# Patient Record
Sex: Male | Born: 1965
Health system: Southern US, Community
[De-identification: ages and names within clinical notes are randomized; demographics above are authoritative.]

## PROBLEM LIST (undated history)

## (undated) DIAGNOSIS — S43006A Unspecified dislocation of unspecified shoulder joint, initial encounter: Secondary | ICD-10-CM

## (undated) DIAGNOSIS — M545 Low back pain, unspecified: Secondary | ICD-10-CM

## (undated) HISTORY — PX: HERNIA REPAIR: SHX51

## (undated) HISTORY — PX: CERVICAL DISC SURGERY: SHX588

## (undated) HISTORY — PX: NECK SURGERY: SHX720

---

## 2003-08-28 ENCOUNTER — Encounter (INDEPENDENT_AMBULATORY_CARE_PROVIDER_SITE_OTHER): Payer: Self-pay | Admitting: *Deleted

## 2003-08-29 ENCOUNTER — Ambulatory Visit (HOSPITAL_COMMUNITY): Admission: RE | Admit: 2003-08-29 | Discharge: 2003-08-29 | Payer: Self-pay | Admitting: General Surgery

## 2003-08-29 ENCOUNTER — Ambulatory Visit (HOSPITAL_BASED_OUTPATIENT_CLINIC_OR_DEPARTMENT_OTHER): Admission: RE | Admit: 2003-08-29 | Discharge: 2003-08-29 | Payer: Self-pay | Admitting: General Surgery

## 2006-02-21 ENCOUNTER — Ambulatory Visit (HOSPITAL_COMMUNITY): Admission: RE | Admit: 2006-02-21 | Discharge: 2006-02-22 | Payer: Self-pay | Admitting: Neurosurgery

## 2008-07-25 ENCOUNTER — Encounter: Admission: RE | Admit: 2008-07-25 | Discharge: 2008-07-25 | Payer: Self-pay | Admitting: Internal Medicine

## 2008-12-31 ENCOUNTER — Encounter: Admission: RE | Admit: 2008-12-31 | Discharge: 2009-02-27 | Payer: Self-pay | Admitting: Internal Medicine

## 2009-02-27 ENCOUNTER — Encounter: Admission: RE | Admit: 2009-02-27 | Discharge: 2009-02-27 | Payer: Self-pay | Admitting: Internal Medicine

## 2009-04-26 ENCOUNTER — Emergency Department (HOSPITAL_COMMUNITY): Admission: EM | Admit: 2009-04-26 | Discharge: 2009-04-26 | Payer: Self-pay | Admitting: Emergency Medicine

## 2010-09-04 NOTE — Op Note (Signed)
Noah Smith, Noah Smith               ACCOUNT NO.:  [id number]   MEDICAL RECORD NO.:  [id number]          PATIENT TYPE:  AMB   LOCATION:  SDS                          FACILITY:  [facility]   PHYSICIAN:  Cristi Loron, M.D.DATE OF BIRTH:  [birth year]   DATE OF PROCEDURE:  02/21/2006  DATE OF DISCHARGE:                                 OPERATIVE REPORT   PREOPERATIVE DIAGNOSES:  C5-6 herniated nucleus pulposus, spinal stenosis,  cervical radiculopathy, cervicalgia.   POSTOPERATIVE DIAGNOSES:  C5-6 herniated nucleus pulposus, spinal stenosis,  cervical radiculopathy, cervicalgia.   PROCEDURES:  C5-6 extensive anterior cervical diskectomy/decompression; C5-6  disk arthroplasty with a Prestige artificial disk.   SURGEON:  Cristi Loron, M.D.   ASSISTANT:  Hewitt Shorts, M.D.   ANESTHESIA:  General endotracheal.   ESTIMATED BLOOD LOSS:  100 cc.   SPECIMENS:  None.   DRAINS:  None.   COMPLICATIONS:  None.   BRIEF HISTORY:  The patient is a 45 year old black male, who suffered from  neck and right arm pain consistent with a right C6 radiculopathy.  He failed  medical management and was worked up with a cervical MRI, which demonstrated  a herniated disk at C5-6 on the right.  I discussed the various treatment  options with the patient, including surgery.  The patient has weighed the  risks, benefits and alternatives of surgery and decided to proceed with a C5-  6 anterior cervical diskectomy, as well as a disk arthroplasty with a  Prestige artificial disk.   DESCRIPTION OF PROCEDURES:  The patient was brought to the operating room by  the anesthesia team.  General endotracheal anesthesia was induced.  The  patient remained in supine position.  A small roll was placed under his  shoulders to keep his neck in the neutral position.  The patient's anterior  cervical region was then prepared with Betadine Scrub and Betadine Solution  and sterile drapes were applied.  I then  injected the area to be incised  with Marcaine with epinephrine solution and used a scalpel to make a  transverse incision in the patient's left anterior neck.  I used a  Metzenbaum scissors to divide the platysma muscle and then to dissect medial  to the sternocleidomastoid, jugular vein and carotid artery.  I carefully  dissected down towards the anterior cervical spine, identifying the  esophagus and retracting it medially with the handheld retractors.  We then  used the Kitner swabs to clear soft tissue from the anterior cervical spine,  and then inserted a bent spinal needle into the exposed interspace.  We  obtained intraoperative radiograph to confirm our location.   We then used electrocautery to detach the medial border of the longus colli  muscle bilaterally from the C5-6 intervertebral disk space.  We inserted a  Caspar self-retaining retractor for exposure.  We then excised the C5-6  intervertebral disk with a 15-blade scalpel and performed a partial  intervertebral diskectomy using the pituitary forceps and the Karlin curets.  We then inserted the distraction pins into the upper aspect of the C5 and  the lower  aspect of the C6 vertebral bodies, and then distracted the  interspace, and used a high-speed drill to drill away the remainder of the  C5-6 intervertebral disk, to drill away some posterior spondylosis, and to  thin down the posterior longitudinal ligament.  We then incised and thinned  down the ligament with an arachnoid knife, and then removed it with a  Kerrison punch, undercutting the vertebral endplates, decompressing the  thecal sac.  We then performed a foraminotomy about the bilateral C6 nerve  root, completing the decompression.  Of note, we did encounter the expected  herniated disk on the right, compressing the right C6 nerve root.   Having completed the decompression, we now turned our attention to the disk  arthroplasty.  We used the barrel-type drill bit  to shape the vertebral  endplates so that the trialers would fit into the interspace, being careful  not to aggressively decorticate the vertebral endplates.  We then inserted  the 6 x 16-mm trialer into the interspace, and under fluoroscopy, it looked  to be in good position and of good size.  We also confirmed that the  distraction pins were in the midline on the A/P view of the fluoroscopy as  well.  We then inserted a 6 mm x 16-mm Prestige artificial disk into the  interspace at C5-6.  I should mention at this point that we had no traction  on the vertebral bodies, and the disk fit in there snug, but we did not have  to impact it with a mallet or anything of the sort.  We simply were able to  slide it snugly into the interspace.  We obtained lateral fluoroscopy and it  looked like the facets were not overdistracted and that the prosthesis was  in good position.  We then used a drill guide to drill four 13-mm holes  through the Prestige disk into the vertebral bodies.  We then secured the  prosthesis into the interspace by placing two 13-mm screws at C5 and two at  C6.  We then again obtained intraoperative fluoroscopy and demonstrated good  position of the prosthesis.  We then secured the screws in place by placing  the locking cap, which we tightened appropriately.  We then irrigated the  wound out with Bacitracin solution, then removed the retractor.  We  inspected the esophagus for any damage, and there was none apparent.  We  then obtained A/P fluoroscopy and the construct looked good in the A/P view  as well.  We then obtained hemostasis with bipolar cautery.  We then  reapproximated the patient's platysma muscle with interrupted 3-0 Vicryl  suture, subcutaneous tissue with interrupted 3-0 Vicryl suture, and the skin  with Steri-Strips and benzoin.  The wound was then coated with Bacitracin  ointment.  A sterile dressing was applied.  The drapes were removed, and the patient was  subsequently extubated by the anesthesia team and transported to  the postanesthesia care unit in stable condition.  All sponge, instrument  and needle counts were correct at the end of this case.      Cristi Loron, M.D.  Electronically Signed     JDJ/MEDQ  D:  02/21/2006  T:  02/21/2006  Job:  [id number]   cc:   Hewitt Shorts, M.D.

## 2012-09-12 ENCOUNTER — Encounter (HOSPITAL_COMMUNITY): Payer: Self-pay

## 2012-09-12 ENCOUNTER — Emergency Department (HOSPITAL_COMMUNITY)
Admission: EM | Admit: 2012-09-12 | Discharge: 2012-09-12 | Disposition: A | Discharge status: Home / Self Care | Payer: BC Managed Care – PPO | Attending: Emergency Medicine | Admitting: Emergency Medicine

## 2012-09-12 DIAGNOSIS — H6120 Impacted cerumen, unspecified ear: Secondary | ICD-10-CM | POA: Insufficient documentation

## 2012-09-12 DIAGNOSIS — Y929 Unspecified place or not applicable: Secondary | ICD-10-CM | POA: Insufficient documentation

## 2012-09-12 DIAGNOSIS — F17200 Nicotine dependence, unspecified, uncomplicated: Secondary | ICD-10-CM | POA: Insufficient documentation

## 2012-09-12 DIAGNOSIS — Y9302 Activity, running: Secondary | ICD-10-CM | POA: Insufficient documentation

## 2012-09-12 DIAGNOSIS — T148XXA Other injury of unspecified body region, initial encounter: Secondary | ICD-10-CM

## 2012-09-12 DIAGNOSIS — S838X9A Sprain of other specified parts of unspecified knee, initial encounter: Secondary | ICD-10-CM | POA: Insufficient documentation

## 2012-09-12 DIAGNOSIS — X500XXA Overexertion from strenuous movement or load, initial encounter: Secondary | ICD-10-CM | POA: Insufficient documentation

## 2012-09-12 DIAGNOSIS — S86819A Strain of other muscle(s) and tendon(s) at lower leg level, unspecified leg, initial encounter: Secondary | ICD-10-CM | POA: Insufficient documentation

## 2012-09-12 DIAGNOSIS — H6122 Impacted cerumen, left ear: Secondary | ICD-10-CM

## 2012-09-12 DIAGNOSIS — H9209 Otalgia, unspecified ear: Secondary | ICD-10-CM | POA: Insufficient documentation

## 2012-09-12 MED ORDER — DOCUSATE SODIUM 250 MG PO CAPS: 250.0000 mg | ORAL_CAPSULE | Freq: Every day | ORAL | Status: DC

## 2012-09-12 NOTE — ED Notes (Signed)
Pt presents with right leg pain. Pt was playing with his 48 year old son yesterday and felt something "pop" in the back of his upper right leg. Able to ambulate since then but with pain. Pt also c/o his left ear feeling "stopped up" for about a week.

## 2012-09-12 NOTE — ED Provider Notes (Signed)
History    This chart was scribed for Noah Horseman PA-C, a non-physician practitioner working with Gwyneth Sprout, MD by Lewanda Rife, ED Scribe. This patient was seen in room WTR8/WTR8 and the patient's care was started at 1759.      CSN: [id number]  Arrival date & time 09/12/12  1603   First MD Initiated Contact with Patient 09/12/12 1728      Chief Complaint  Patient presents with  . Leg Pain    (Consider location/radiation/quality/duration/timing/severity/associated sxs/prior treatment) The history is provided by the patient.   HPI Comments: Noah Smith is a 47 y.o. male who presents to the Emergency Department complaining of constant moderate right posterior upper leg pain onset last night. Reports racing with his son yesterday and felt a "pop" behind his leg. Additionally, reports left ear discomfort. Denies emesis, rhinorrhea, constipation, dizziness, fever, and weakness. Reports pain is aggravated when sitting and to touch. Denies alleviating factors. Reports steady, but stiff gait with pain. Reports he is walks frequently at work. Reports trying generic ear drops with no relief of ear discomfort.   History reviewed. No pertinent past medical history.  Past Surgical History  Procedure Laterality Date  . Hernia repair    . Neck surgery      No family history on file.  History  Substance Use Topics  . Smoking status: Current Every Day Smoker -- 0.50 packs/day  . Smokeless tobacco: Not on file  . Alcohol Use: Yes     Comment: weekends       Review of Systems  HENT: Positive for ear pain.   Musculoskeletal: Positive for myalgias (posterior upper right leg pain).  Skin: Negative for wound.  All other systems reviewed and are negative.   A complete 10 system review of systems was obtained and all systems are negative except as noted in the HPI and PMH.    Allergies  Review of patient's allergies indicates no known allergies.  Home Medications    Current Outpatient Rx  Name  Route  Sig  Dispense  Refill  . Multiple Vitamin (MULTIVITAMIN WITH MINERALS) TABS   Oral   Take 1 tablet by mouth daily.           BP 128/81  Pulse 63  Temp(Src) 98.5 F (36.9 C) (Oral)  Resp 12  Ht 6' (1.829 m)  Wt 185 lb (83.915 kg)  BMI 25.08 kg/m2  SpO2 98%  Physical Exam  Nursing note and vitals reviewed. Constitutional: He is oriented to person, place, and time. He appears well-developed and well-nourished. No distress.  HENT:  Head: Normocephalic and atraumatic.  Left ear positive for cerumen impaction   Eyes: EOM are normal.  Neck: Neck supple. No tracheal deviation present.  Cardiovascular: Normal rate.   Pulmonary/Chest: Effort normal. No respiratory distress.  Musculoskeletal: Normal range of motion.       Right upper leg: He exhibits tenderness. He exhibits no bony tenderness, no swelling, no edema and no deformity.  Upper posterior right hamstrings tender to palpation, no masses, bulges, bruising, or deformities. ROM 4/5. Strength 4/5   Neurological: He is alert and oriented to person, place, and time.  Skin: Skin is warm and dry.  Psychiatric: He has a normal mood and affect. His behavior is normal.    ED Course  Procedures (including critical care time) Medications - No data to display 6:09 PM Pt given orthopedic f/u and recommendations given to take ibuprofen and ice      1. Muscle  strain   2. Cerumen impaction, left       MDM  Patient with muscle strain to the hamstring muscle group following a foot race with his son, no bony tenderness, no indication for imaging at this time. Also cerumen impaction of left ear. Treat conservatively. Followup with orthopedics for leg pain.      I personally performed the services described in this documentation, which was scribed in my presence. The recorded information has been reviewed and is accurate.     Noah Horseman, PA-C 09/13/12 1520

## 2012-09-14 NOTE — ED Provider Notes (Signed)
Medical screening examination/treatment/procedure(s) were performed by non-physician practitioner and as supervising physician I was immediately available for consultation/collaboration.   Gwyneth Sprout, MD 09/14/12 [phone]

## 2015-09-28 ENCOUNTER — Emergency Department (HOSPITAL_COMMUNITY)
Admission: EM | Admit: 2015-09-28 | Discharge: 2015-09-28 | Disposition: A | Discharge status: Home / Self Care | Payer: Self-pay | Attending: Emergency Medicine | Admitting: Emergency Medicine

## 2015-09-28 ENCOUNTER — Emergency Department (HOSPITAL_COMMUNITY): Payer: Self-pay

## 2015-09-28 ENCOUNTER — Encounter (HOSPITAL_COMMUNITY): Payer: Self-pay | Admitting: Emergency Medicine

## 2015-09-28 DIAGNOSIS — R002 Palpitations: Secondary | ICD-10-CM | POA: Insufficient documentation

## 2015-09-28 DIAGNOSIS — F17200 Nicotine dependence, unspecified, uncomplicated: Secondary | ICD-10-CM | POA: Insufficient documentation

## 2015-09-28 DIAGNOSIS — F410 Panic disorder [episodic paroxysmal anxiety] without agoraphobia: Secondary | ICD-10-CM | POA: Insufficient documentation

## 2015-09-28 LAB — CBC WITH DIFFERENTIAL/PLATELET
BASOS PCT: 0 %
Basophils Absolute: 0 10*3/uL (ref 0.0–0.1)
Eosinophils Absolute: 0.1 10*3/uL (ref 0.0–0.7)
Eosinophils Relative: 2 %
HCT: 48.2 % (ref 39.0–52.0)
Hemoglobin: 15.8 g/dL (ref 13.0–17.0)
LYMPHS ABS: 3 10*3/uL (ref 0.7–4.0)
Lymphocytes Relative: 48 %
MCH: 29.2 pg (ref 26.0–34.0)
MCHC: 32.8 g/dL (ref 30.0–36.0)
MCV: 89.1 fL (ref 78.0–100.0)
MONOS PCT: 10 %
Monocytes Absolute: 0.6 10*3/uL (ref 0.1–1.0)
NEUTROS ABS: 2.4 10*3/uL (ref 1.7–7.7)
NEUTROS PCT: 40 %
PLATELETS: 240 10*3/uL (ref 150–400)
RBC: 5.41 MIL/uL (ref 4.22–5.81)
RDW: 13.3 % (ref 11.5–15.5)
WBC: 6.1 10*3/uL (ref 4.0–10.5)

## 2015-09-28 LAB — RAPID URINE DRUG SCREEN, HOSP PERFORMED
AMPHETAMINES: NOT DETECTED
Barbiturates: NOT DETECTED
Benzodiazepines: NOT DETECTED
Cocaine: NOT DETECTED
Opiates: NOT DETECTED
Tetrahydrocannabinol: NOT DETECTED

## 2015-09-28 LAB — BASIC METABOLIC PANEL
Anion gap: 9 (ref 5–15)
BUN: 10 mg/dL (ref 6–20)
CALCIUM: 9.2 mg/dL (ref 8.9–10.3)
CO2: 25 mmol/L (ref 22–32)
CREATININE: 1.08 mg/dL (ref 0.61–1.24)
Chloride: 105 mmol/L (ref 101–111)
Glucose, Bld: 104 mg/dL — ABNORMAL HIGH (ref 65–99)
Potassium: 4.5 mmol/L (ref 3.5–5.1)
SODIUM: 139 mmol/L (ref 135–145)

## 2015-09-28 LAB — D-DIMER, QUANTITATIVE (NOT AT ARMC): D DIMER QUANT: 0.32 ug{FEU}/mL (ref 0.00–0.50)

## 2015-09-28 LAB — TROPONIN I

## 2015-09-28 NOTE — ED Notes (Signed)
Pt left at this time with all belongings. Refused wheelchair. 

## 2015-09-28 NOTE — ED Notes (Signed)
Pt. presents with multiple complaints : mild SOB , lightheaded , feels " nervous/shaking" with anxiety onset this week . Denies cough .

## 2015-09-28 NOTE — Discharge Instructions (Signed)
Palpitations There is no evidence of heart attack or blood clot in the lung. Follow up with the cardiologist to get a holter monitor. Return to the ED if you develop new or worsening symptoms. A palpitation is the feeling that your heartbeat is irregular or is faster than normal. It may feel like your heart is fluttering or skipping a beat. Palpitations are usually not a serious problem. However, in some cases, you may need further medical evaluation. CAUSES  Palpitations can be caused by:  Smoking.  Caffeine or other stimulants, such as diet pills or energy drinks.  Alcohol.  Stress and anxiety.  Strenuous physical activity.  Fatigue.  Certain medicines.  Heart disease, especially if you have a history of irregular heart rhythms (arrhythmias), such as atrial fibrillation, atrial flutter, or supraventricular tachycardia.  An improperly working pacemaker or defibrillator. DIAGNOSIS  To find the cause of your palpitations, your health care provider will take your medical history and perform a physical exam. Your health care provider may also have you take a test called an ambulatory electrocardiogram (ECG). An ECG records your heartbeat patterns over a 24-hour period. You may also have other tests, such as:  Transthoracic echocardiogram (TTE). During echocardiography, sound waves are used to evaluate how blood flows through your heart.  Transesophageal echocardiogram (TEE).  Cardiac monitoring. This allows your health care provider to monitor your heart rate and rhythm in real time.  Holter monitor. This is a portable device that records your heartbeat and can help diagnose heart arrhythmias. It allows your health care provider to track your heart activity for several days, if needed.  Stress tests by exercise or by giving medicine that makes the heart beat faster. TREATMENT  Treatment of palpitations depends on the cause of your symptoms and can vary greatly. Most cases of  palpitations do not require any treatment other than time, relaxation, and monitoring your symptoms. Other causes, such as atrial fibrillation, atrial flutter, or supraventricular tachycardia, usually require further treatment. HOME CARE INSTRUCTIONS   Avoid:  Caffeinated coffee, tea, soft drinks, diet pills, and energy drinks.  Chocolate.  Alcohol.  Stop smoking if you smoke.  Reduce your stress and anxiety. Things that can help you relax include:  A method of controlling things in your body, such as your heartbeats, with your mind (biofeedback).  Yoga.  Meditation.  Physical activity such as swimming, jogging, or walking.  Get plenty of rest and sleep. SEEK MEDICAL CARE IF:   You continue to have a fast or irregular heartbeat beyond 24 hours.  Your palpitations occur more often. SEEK IMMEDIATE MEDICAL CARE IF:  You have chest pain or shortness of breath.  You have a severe headache.  You feel dizzy or you faint. MAKE SURE YOU:  Understand these instructions.  Will watch your condition.  Will get help right away if you are not doing well or get worse.   This information is not intended to replace advice given to you by your health care provider. Make sure you discuss any questions you have with your health care provider.   Document Released: 04/02/2000 Document Revised: 04/10/2013 Document Reviewed: 06/04/2011 Elsevier Interactive Patient Education [employer]2016 [employer] Inc[employer]

## 2015-09-28 NOTE — ED Provider Notes (Signed)
CSN: [id number]     Arrival date & time 09/28/15  1957 History   First MD Initiated Contact with Patient 09/28/15 2041     Chief Complaint  Patient presents with  . Shortness of Breath  . Shaking  . Panic Attack     (Consider location/radiation/quality/duration/timing/severity/associated sxs/prior Treatment) HPI Comments: Patient reports with a three-day history of lightheadedness with standing, shortness of breath, nervous and shaky feeling as well as palpitations. Denies chest pain. Denies fever or cough. Notably his family member was killed last week. He denies any suicidal or homicidal thoughts. No cardiac history. Denies chest pain. Does have intermittent episodes of pain in his left axilla lasting for just a few seconds at a time, and go. This does not radiate. There is no associated shortness of breath, nausea, vomiting, diaphoresis, syncope. No leg pain or leg swelling. No abdominal pain.  Patient is a 50 y.o. male presenting with shortness of breath. The history is provided by the patient.  Shortness of Breath Associated symptoms: no abdominal pain, no chest pain, no cough, no fever, no headaches, no rash, no vomiting and no wheezing     History reviewed. No pertinent past medical history. Past Surgical History  Procedure Laterality Date  . Hernia repair    . Neck surgery     No family history on file. Social History  Substance Use Topics  . Smoking status: Current Every Day Smoker -- 0.00 packs/day  . Smokeless tobacco: None  . Alcohol Use: Yes    Review of Systems  Constitutional: Positive for fatigue. Negative for fever, activity change and appetite change.  HENT: Negative for congestion.   Eyes: Negative for visual disturbance.  Respiratory: Positive for shortness of breath. Negative for cough, chest tightness and wheezing.   Cardiovascular: Negative for chest pain, palpitations and leg swelling.  Gastrointestinal: Negative for nausea, vomiting and abdominal pain.   Endocrine: Negative for polydipsia and polyuria.  Genitourinary: Negative for dysuria, hematuria and testicular pain.  Musculoskeletal: Negative for myalgias and arthralgias.  Skin: Negative for rash.  Neurological: Negative for dizziness, weakness, light-headedness and headaches.  Psychiatric/Behavioral: The patient is nervous/anxious.   A complete 10 system review of systems was obtained and all systems are negative except as noted in the HPI and PMH.      Allergies  Review of patient's allergies indicates no known allergies.  Home Medications   Prior to Admission medications   Medication Sig Start Date End Date Taking? Authorizing Provider  docusate sodium (COLACE) 250 MG capsule Take 1 capsule (250 mg total) by mouth daily. Patient not taking: Reported on 09/28/2015 09/12/12   Roxy Horsemanobert Browning, PA-C   BP 118/86 mmHg  Pulse 52  Temp(Src) 98.1 F (36.7 C) (Oral)  Resp 17  Ht 6' (1.829 m)  Wt 167 lb (75.751 kg)  BMI 22.64 kg/m2  SpO2 97% Physical Exam  Constitutional: He is oriented to person, place, and time. He appears well-developed and well-nourished. No distress.  Anxious appearing.  HENT:  Head: Normocephalic and atraumatic.  Mouth/Throat: Oropharynx is clear and moist. No oropharyngeal exudate.  Eyes: Conjunctivae and EOM are normal. Pupils are equal, round, and reactive to light.  Neck: Normal range of motion. Neck supple.  No meningismus.  Cardiovascular: Normal rate, regular rhythm, normal heart sounds and intact distal pulses.   No murmur heard. Pulmonary/Chest: Effort normal and breath sounds normal. No respiratory distress. He exhibits no tenderness.  Abdominal: Soft. There is no tenderness. There is no rebound and no  guarding.  Musculoskeletal: Normal range of motion. He exhibits no edema or tenderness.  Neurological: He is alert and oriented to person, place, and time. No cranial nerve deficit. He exhibits normal muscle tone. Coordination normal.  No ataxia  on finger to nose bilaterally. No pronator drift. 5/5 strength throughout. CN 2-12 intact.Equal grip strength. Sensation intact.   Skin: Skin is warm.  Psychiatric: He has a normal mood and affect. His behavior is normal.  Nursing note and vitals reviewed.   ED Course  Procedures (including critical care time) Labs Review Labs Reviewed  BASIC METABOLIC PANEL - Abnormal; Notable for the following:    Glucose, Bld 104 (*)    All other components within normal limits  CBC WITH DIFFERENTIAL/PLATELET  TROPONIN I  URINE RAPID DRUG SCREEN, HOSP PERFORMED  D-DIMER, QUANTITATIVE (NOT AT [facility])    Imaging Review Dg Chest 2 View  09/28/2015  CLINICAL DATA:  Acute onset of heart palpitations and shortness of breath. Initial encounter. EXAM: CHEST  2 VIEW COMPARISON:  None. FINDINGS: The lungs are well-aerated and clear. There is no evidence of focal opacification, pleural effusion or pneumothorax. The heart is normal in size; the mediastinal contour is within normal limits. No acute osseous abnormalities are seen. Cervical spinal fusion hardware is noted. IMPRESSION: No acute cardiopulmonary process seen. Electronically Signed   By: Roanna Raider M.D.   On: 09/28/2015 22:12   I have personally reviewed and evaluated these images and lab results as part of my medical decision-making.   EKG Interpretation   Date/Time:  Sunday September 28 2015 21:52:21 EDT Ventricular Rate:  62 PR Interval:  169 QRS Duration: 85 QT Interval:  419 QTC Calculation: 425 R Axis:   42 Text Interpretation:  Sinus rhythm Low voltage, extremity leads T waves  now upright  Confirmed by Manus Gunning  MD, Tevin Shillingford [phone]) on 09/28/2015  10:27:04 PM      MDM   Final diagnoses:  Palpitations   Patient with palpitations, lightheadedness with standing, nervous feeling worsening over the past 3 days.  denies chest pain, SOB.  His axillary pain lasts just a second or two and is atypical for ACS.  EKG with nonspecific T wave  changes, resolved on repeat.  Labs reassuring.  Troponin negative, d-dimer negative. Low suspicion for ACS. Few PACs on monitor here. This may be palpitations he feeling. Anxiety likely contributing. Refer to cardiology for holter monitor. Establish care with PCP. Ensure hydration at home. Declines grief counseling.  No SI or HI  Return precautions discussed.   Glynn Octave, MD 09/29/15 [phone]

## 2015-10-03 ENCOUNTER — Encounter: Payer: Self-pay | Admitting: Cardiology

## 2015-10-05 NOTE — Progress Notes (Signed)
Electrophysiology Office Note   Date:  10/06/2015   ID:  Trinda Pascal, DOB 04/04/1966, MRN [id number]  PCP:  No primary care provider on file. Primary Electrophysiologist:  Kamisha Ell Jorja Loa, MD    Chief Complaint  Patient presents with  . Chest Pain    pressure, states it happened last night   . Shortness of Breath    pt states not really     History of Present Illness: AASIM RESTIVO is a 50 y.o. male who presents today for electrophysiology evaluation.   He presented to the ER with 3 days of lightheadedness with standing, SOB, shaky feeling, nervousness, and chest pain.  Of note, had family member killed last week.  He says that he has been having pain in the lower part of his left chest. He says the pain is a dull sensation. There is also a sharp pain in his left axilla that lasts for seconds. The dull sensation lasts for minutes to hours at a time. He says that he does not feel that it is associated with exertion. 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 He says that when he is walking around he does not notice the discomfort, but when he stops, he is aware that there is something wrong. In the emergency room he had EKGs that showed no evidence of ischemia, and a troponin and d-dimer which were nonrevealing.  He says that he also continues to feel shaky and generally unsteady. All of his symptoms started approximately one week ago.   Today, he denies symptoms of palpitations, shortness of breath, orthopnea, PND, lower extremity edema, claudication, dizziness, presyncope, syncope, bleeding, or neurologic sequela. The patient is tolerating medications without difficulties and is otherwise without complaint today.    No past medical history on file. Takes no medications currently Past Surgical History  Procedure Laterality Date  . Hernia repair    . Neck surgery       No current outpatient prescriptions on file.   No current facility-administered  medications for this visit.    Allergies:   Review of patient's allergies indicates no known allergies.   Social History:  The patient  reports that he has been smoking.  He does not have any smokeless tobacco history on file. He reports that he drinks alcohol. He reports that he does not use illicit drugs.   Family History:  The patient's family history includes Cancer in his father.    ROS:  Please see the history of present illness.   Otherwise, review of systems is positive for chest pain, visual changes, back pain.   All other systems are reviewed and negative.    PHYSICAL EXAM: VS:  BP 116/84 mmHg  Pulse 85  Ht 6' (1.829 m)  Wt 165 lb (74.844 kg)  BMI 22.37 kg/m2  SpO2 95% , BMI Body mass index is 22.37 kg/(m^2). GEN: Well nourished, well developed, in no acute distress HEENT: normal Neck: no JVD, carotid bruits, or masses Cardiac: RRR; no murmurs, rubs, or gallops,no edema  Respiratory:  clear to auscultation bilaterally, normal work of breathing GI: soft, nontender, nondistended, + BS MS: no deformity or atrophy Skin: warm and dry Neuro:  Strength and sensation are intact Psych: euthymic mood, full affect  EKG:  EKG is not ordered today.   Recent Labs: 09/28/2015: BUN 10; Creatinine, Ser 1.08; Hemoglobin 15.8; Platelets 240; Potassium 4.5; Sodium 139    Lipid Panel  No results  found for: CHOL, TRIG, HDL, CHOLHDL, VLDL, LDLCALC, LDLDIRECT   Wt Readings from Last 3 Encounters:  10/06/15 165 lb (74.844 kg)  09/28/15 167 lb (75.751 kg)  09/12/12 185 lb (83.915 kg)      Other studies Reviewed: Additional studies/ records that were reviewed today include: ER notes   ASSESSMENT AND PLAN:  1.  Chest pain: Chest pain could be cardiac related. There are both typical and atypical features of his pain. Due to that, we'll order a rest stress Myoview to determine if he does have cardiac causes for his chest pain. He is also having generalized complaints that could be  related to anxiety due to recent events. We'll refer him to primary care for further workup.    Current medicines are reviewed at length with the patient today.   The patient does not have concerns regarding his medicines.  The following changes were made today:  none  Labs/ tests ordered today include:  Orders Placed This Encounter  Procedures  . Myocardial Perfusion Imaging     Disposition:   FU with Bailie Christenbury PRN  Signed, Adelle Zachar Jorja LoaMartin Taysean Wager, MD  10/06/2015 8:41 AM     [facility] [address] [place] [address] [phone] (office) [phone] (fax)

## 2015-10-06 ENCOUNTER — Ambulatory Visit (INDEPENDENT_AMBULATORY_CARE_PROVIDER_SITE_OTHER): Payer: BLUE CROSS/BLUE SHIELD | Admitting: Cardiology

## 2015-10-06 ENCOUNTER — Encounter: Payer: Self-pay | Admitting: Cardiology

## 2015-10-06 VITALS — BP 116/84 | HR 85 | Ht 72.0 in | Wt 165.0 lb

## 2015-10-06 DIAGNOSIS — R079 Chest pain, unspecified: Secondary | ICD-10-CM

## 2015-10-06 NOTE — Patient Instructions (Addendum)
Medication Instructions:  Your physician recommends that you continue on your current medications as directed. Please refer to the Current Medication list given to you today.  Labwork: None ordered  Testing/Procedures: Your physician has requested that you have a lexiscan myoview. For further information please visit [email or web address] Please follow instruction sheet, as given.  Follow-Up: You have been referred to establish care with a primary care physician.  A list of physicians will be given to you to choose from and call to establish care.  To be determined once myoview test has been reviewed by the physician.  We will call you with the results.  If you need a refill on your cardiac medications before your next appointment, please call your pharmacy.   Thank you for choosing [facility]!! Dory Horn, RN [phone]  Any Other Special Instructions Will Be Listed Below (If Applicable). Pharmacologic Stress Electrocardiogram A pharmacologic stress electrocardiogram is a heart (cardiac) test that uses nuclear imaging to evaluate the blood supply to your heart. This test may also be called a pharmacologic stress electrocardiography. Pharmacologic means that a medicine is used to increase your heart rate and blood pressure.  This stress test is done to find areas of poor blood flow to the heart by determining the extent of coronary artery disease (CAD). Some people exercise on a treadmill, which naturally increases the blood flow to the heart. For those people unable to exercise on a treadmill, a medicine is used. This medicine stimulates your heart and will cause your heart to beat harder and more quickly, as if you were exercising.  Pharmacologic stress tests can help determine:  The adequacy of blood flow to your heart during increased levels of activity in order to clear you for discharge home.  The extent of coronary artery blockage caused by CAD.  Your prognosis if you  have suffered a heart attack.  The effectiveness of cardiac procedures done, such as an angioplasty, which can increase the circulation in your coronary arteries.  Causes of chest pain or pressure. LET [facility] CARE PROVIDER KNOW ABOUT:  Any allergies you have.  All medicines you are taking, including vitamins, herbs, eye drops, creams, and over-the-counter medicines.  Previous problems you or members of your family have had with the use of anesthetics.  Any blood disorders you have.  Previous surgeries you have had.  Medical conditions you have.  Possibility of pregnancy, if this applies.  If you are currently breastfeeding. RISKS AND COMPLICATIONS Generally, this is a safe procedure. However, as with any procedure, complications can occur. Possible complications include:  You develop pain or pressure in the following areas:  Chest.  Jaw or neck.  Between your shoulder blades.  Radiating down your left arm.  Headache.  Dizziness or light-headedness.  Shortness of breath.  Increased or irregular heartbeat.  Low blood pressure.  Nausea or vomiting.  Flushing.  Redness going up the arm and slight pain during injection of medicine.  Heart attack (rare). BEFORE THE PROCEDURE   Avoid all forms of caffeine for 24 hours before your test or as directed by your health care provider. This includes coffee, tea (even decaffeinated tea), caffeinated sodas, chocolate, cocoa, and certain pain medicines.  Follow your health care provider's instructions regarding eating and drinking before the test.  Take your medicines as directed at regular times with water unless instructed otherwise. Exceptions may include:  If you have diabetes, ask how you are to take your insulin or pills. It is common to  adjust insulin dosing the morning of the test.  If you are taking beta-blocker medicines, it is important to talk to your health care provider about these medicines well before  the date of your test. Taking beta-blocker medicines may interfere with the test. In some cases, these medicines need to be changed or stopped 24 hours or more before the test.  If you wear a nitroglycerin patch, it may need to be removed prior to the test. Ask your health care provider if the patch should be removed before the test.  If you use an inhaler for any breathing condition, bring it with you to the test.  If you are an outpatient, bring a snack so you can eat right after the stress phase of the test.  Do not smoke for 4 hours prior to the test or as directed by your health care provider.  Do not apply lotions, powders, creams, or oils on your chest prior to the test.  Wear comfortable shoes and clothing. Let your health care provider know if you were unable to complete or follow the preparations for your test. PROCEDURE   Multiple patches (electrodes) will be put on your chest. If needed, small areas of your chest may be shaved to get better contact with the electrodes. Once the electrodes are attached to your body, multiple wires will be attached to the electrodes, and your heart rate will be monitored.  An IV access will be started. A nuclear trace (isotope) is given. The isotope may be given intravenously, or it may be swallowed. Nuclear refers to several types of radioactive isotopes, and the nuclear isotope lights up the arteries so that the nuclear images are clear. The isotope is absorbed by your body. This results in low radiation exposure.  A resting nuclear image is taken to show how your heart functions at rest.  A medicine is given through the IV access.  A second scan is done about 1 hour after the medicine injection and determines how your heart functions under stress.  During this stress phase, you will be connected to an electrocardiogram machine. Your blood pressure and oxygen levels will be monitored. AFTER THE PROCEDURE   Your heart rate and blood pressure  will be monitored after the test.  You may return to your normal schedule, including diet,activities, and medicines, unless your health care provider tells you otherwise.   This information is not intended to replace advice given to you by your health care provider. Make sure you discuss any questions you have with your health care provider.   Document Released: 08/22/2008 Document Revised: 04/10/2013 Document Reviewed: 12/11/2012 Elsevier Interactive Patient Education [employer] Inc.

## 2015-10-07 ENCOUNTER — Telehealth (HOSPITAL_COMMUNITY): Payer: Self-pay | Admitting: *Deleted

## 2015-10-07 NOTE — Telephone Encounter (Signed)
Left message on voicemail per DPR in reference to upcoming appointment scheduled on 10/08/15 at 7:15 with detailed instructions given per Myocardial Perfusion Study Information Sheet for the test. LM to arrive 15 minutes early, and that it is imperative to arrive on time for appointment to keep from having the test rescheduled. If you need to cancel or reschedule your appointment, please call the office within 24 hours of your appointment. Failure to do so may result in a cancellation of your appointment, and a $50 no show fee. Phone number given for call back for any questions.

## 2015-10-08 ENCOUNTER — Ambulatory Visit: Payer: BLUE CROSS/BLUE SHIELD | Admitting: Cardiology

## 2015-10-08 ENCOUNTER — Ambulatory Visit (HOSPITAL_COMMUNITY): Payer: BLUE CROSS/BLUE SHIELD | Attending: Cardiovascular Disease

## 2015-10-08 DIAGNOSIS — R9439 Abnormal result of other cardiovascular function study: Secondary | ICD-10-CM | POA: Insufficient documentation

## 2015-10-08 DIAGNOSIS — R079 Chest pain, unspecified: Secondary | ICD-10-CM

## 2015-10-08 LAB — MYOCARDIAL PERFUSION IMAGING
CHL CUP MPHR: 170 {beats}/min
CHL CUP NUCLEAR SRS: 0
CHL CUP NUCLEAR SSS: 2
CSEPED: 10 min
CSEPHR: 94 %
Estimated workload: 11.7 METS
Exercise duration (sec): 31 s
LHR: 0.27
LV sys vol: 52 mL
LVDIAVOL: 107 mL (ref 62–150)
NUC STRESS TID: 0.94
Peak HR: 160 {beats}/min
Rest HR: 52 {beats}/min
SDS: 2

## 2015-10-08 MED ORDER — TECHNETIUM TC 99M TETROFOSMIN IV KIT
31.3000 | PACK | Freq: Once | INTRAVENOUS | Status: AC | PRN
  Administered 2015-10-08: 31.3 via INTRAVENOUS
  Filled 2015-10-08: qty 31

## 2015-10-08 MED ORDER — TECHNETIUM TC 99M TETROFOSMIN IV KIT
10.7000 | PACK | Freq: Once | INTRAVENOUS | Status: AC | PRN
  Administered 2015-10-08: 11 via INTRAVENOUS
  Filled 2015-10-08: qty 11

## 2017-04-29 ENCOUNTER — Other Ambulatory Visit: Payer: Self-pay

## 2017-04-29 ENCOUNTER — Encounter: Payer: Self-pay | Admitting: Physician Assistant

## 2017-04-29 ENCOUNTER — Ambulatory Visit: Payer: BLUE CROSS/BLUE SHIELD | Admitting: Physician Assistant

## 2017-04-29 ENCOUNTER — Ambulatory Visit (INDEPENDENT_AMBULATORY_CARE_PROVIDER_SITE_OTHER): Payer: BLUE CROSS/BLUE SHIELD

## 2017-04-29 VITALS — BP 116/78 | HR 53 | Resp 16 | Ht 72.0 in | Wt 171.2 lb

## 2017-04-29 DIAGNOSIS — M792 Neuralgia and neuritis, unspecified: Secondary | ICD-10-CM

## 2017-04-29 DIAGNOSIS — M50321 Other cervical disc degeneration at C4-C5 level: Secondary | ICD-10-CM | POA: Diagnosis not present

## 2017-04-29 MED ORDER — ACETAMINOPHEN 500 MG PO TABS: 1000.0000 mg | ORAL_TABLET | Freq: Three times a day (TID) | ORAL | 99 refills | Status: AC | PRN

## 2017-04-29 MED ORDER — MELOXICAM 15 MG PO TABS: 7.5000 mg | ORAL_TABLET | Freq: Every day | ORAL | 0 refills | Status: AC

## 2017-04-29 MED ORDER — CYCLOBENZAPRINE HCL 10 MG PO TABS: 5.0000 mg | ORAL_TABLET | Freq: Three times a day (TID) | ORAL | 0 refills | Status: DC | PRN

## 2017-04-29 NOTE — Progress Notes (Signed)
04/29/2017 5:18 PM   DOB: 07/16/1965 / MRN: [id number]  SUBJECTIVE:  Noah Smith is a 52 y.o. male presenting for burning right trapezius pain present for 1 month but worse over the last week. Has a history of cervical instrumentation. No weakness or numbness. No history of PUD, CKD, HTN.   He has No Known Allergies.   He  has no past medical history on file.    He  reports that he has been smoking.  He has been smoking about 0.00 packs per day. he has never used smokeless tobacco. He reports that he drinks alcohol. He reports that he does not use drugs. He  has no sexual activity history on file. The patient  has a past surgical history that includes Hernia repair and Neck surgery.  His family history includes Cancer in his father.  Review of Systems  Respiratory: Negative for cough.   Cardiovascular: Negative for chest pain.  Neurological: Positive for tingling. Negative for focal weakness.    The problem list and medications were reviewed and updated by myself where necessary and exist elsewhere in the encounter.   OBJECTIVE:  BP 116/78 (BP Location: Right Arm, Patient Position: Sitting, Cuff Size: Normal)   Pulse (!) 53   Resp 16   Ht 6' (1.829 m)   Wt 171 lb 3.2 oz (77.7 kg)   SpO2 99%   BMI 23.22 kg/m   Physical Exam  Constitutional: He is oriented to person, place, and time. He appears well-developed. He is active and cooperative.  Non-toxic appearance.  Cardiovascular: Normal rate, regular rhythm, S1 normal, S2 normal, normal heart sounds, intact distal pulses and normal pulses. Exam reveals no gallop and no friction rub.  No murmur heard. Pulmonary/Chest: Effort normal. No stridor. No tachypnea. No respiratory distress. He has no wheezes. He has no rales.  Abdominal: He exhibits no distension.  Musculoskeletal: He exhibits no edema.       Cervical back: He exhibits tenderness, pain and spasm. He exhibits normal range of motion, no bony tenderness and no  deformity.  Neurological: He is alert and oriented to person, place, and time. He has normal reflexes. He displays normal reflexes. No cranial nerve deficit. He exhibits normal muscle tone. Coordination normal.  Skin: Skin is warm and dry. He is not diaphoretic. No pallor.  Vitals reviewed.  Lab Results  Component Value Date   WBC 6.1 09/28/2015   HGB 15.8 09/28/2015   HCT 48.2 09/28/2015   MCV 89.1 09/28/2015   PLT 240 09/28/2015    Lab Results  Component Value Date   CREATININE 1.08 09/28/2015   BUN 10 09/28/2015   NA 139 09/28/2015   K 4.5 09/28/2015   CL 105 09/28/2015   CO2 25 09/28/2015      No results found for this or any previous visit (from the past 72 hour(s)).  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 Loss of disc height at C3-4 and C4-5 with mild posterior osseous ridging at both of these levels. 1 mm retrolisthesis at C4-5. Straightening of the normal cervical lordosis. No prevertebral soft tissue swelling. The maxillary teeth obscure C1 on the open mouth odontoid view attempt but the odontoid appears normal. IMPRESSION: 1. 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 2. Spondylosis and degenerative disc disease at C3-4 and C4-5. 3. Loss  of the normal cervical lordosis, which can be associated with muscle spasm. Electronically Signed   By: Gaylyn RongWalter  Liebkemann M.D.   On: 04/29/2017 17:13    ASSESSMENT AND PLAN:  Noah Smith was seen today for shoulder pain.  Diagnoses and all orders for this visit:  Radicular pain in right arm -     DG Cervical Spine 2 or 3 views; Future -     meloxicam (MOBIC) 15 MG tablet; Take 0.5-1 tablets (7.5-15 mg total) by mouth daily. Take with food. Do not take Ibuprofen, Goody's, or Aleve while taking this medication. -     cyclobenzaprine (FLEXERIL) 10 MG  tablet; Take 0.5-1 tablets (5-10 mg total) by mouth 3 (three) times daily as needed for muscle spasms. -     acetaminophen (TYLENOL) 500 MG tablet; Take 2 tablets (1,000 mg total) by mouth every 8 (eight) hours as needed for mild pain, moderate pain or fever. Take 2 tabs every 8 hours.    The patient is advised to call or return to clinic if he does not see an improvement in symptoms, or to seek the care of the closest emergency department if he worsens with the above plan.   Deliah BostonMichael Keyaan Lederman, MHS, PA-C [facility] 04/29/2017 5:18 PM

## 2017-04-29 NOTE — Patient Instructions (Signed)
COme back in about two weeks if you are not getting better. We can consider a steroid and/or a trigger point injection at that time.

## 2017-05-23 ENCOUNTER — Encounter (HOSPITAL_COMMUNITY): Payer: Self-pay

## 2017-05-23 ENCOUNTER — Emergency Department (HOSPITAL_COMMUNITY)
Admission: EM | Admit: 2017-05-23 | Discharge: 2017-05-23 | Disposition: A | Discharge status: Home / Self Care | Payer: BLUE CROSS/BLUE SHIELD | Attending: Emergency Medicine | Admitting: Emergency Medicine

## 2017-05-23 DIAGNOSIS — F17210 Nicotine dependence, cigarettes, uncomplicated: Secondary | ICD-10-CM | POA: Diagnosis not present

## 2017-05-23 DIAGNOSIS — Z79899 Other long term (current) drug therapy: Secondary | ICD-10-CM | POA: Diagnosis not present

## 2017-05-23 DIAGNOSIS — Z041 Encounter for examination and observation following transport accident: Secondary | ICD-10-CM | POA: Insufficient documentation

## 2017-05-23 DIAGNOSIS — M546 Pain in thoracic spine: Secondary | ICD-10-CM | POA: Diagnosis not present

## 2017-05-23 DIAGNOSIS — M542 Cervicalgia: Secondary | ICD-10-CM | POA: Diagnosis not present

## 2017-05-23 MED ORDER — CYCLOBENZAPRINE HCL 10 MG PO TABS: 10.0000 mg | ORAL_TABLET | Freq: Two times a day (BID) | ORAL | 0 refills | Status: DC | PRN
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
## 2017-05-23 NOTE — ED Notes (Signed)
Patient ready for DC. Discussed use of medications for pain control. Patient states understanding.
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
## 2017-06-01 DIAGNOSIS — M542 Cervicalgia: Secondary | ICD-10-CM | POA: Diagnosis not present

## 2017-06-01 DIAGNOSIS — M5412 Radiculopathy, cervical region: Secondary | ICD-10-CM | POA: Diagnosis not present

## 2017-07-06 DIAGNOSIS — M542 Cervicalgia: Secondary | ICD-10-CM | POA: Diagnosis not present

## 2017-07-21 ENCOUNTER — Other Ambulatory Visit: Payer: Self-pay | Admitting: Neurosurgery

## 2017-07-21 DIAGNOSIS — M5412 Radiculopathy, cervical region: Secondary | ICD-10-CM

## 2017-08-03 ENCOUNTER — Ambulatory Visit
Admission: RE | Admit: 2017-08-03 | Discharge: 2017-08-03 | Disposition: A | Discharge status: Home / Self Care | Payer: BLUE CROSS/BLUE SHIELD | Source: Ambulatory Visit | Attending: Neurosurgery | Admitting: Neurosurgery

## 2017-08-03 DIAGNOSIS — M5412 Radiculopathy, cervical region: Secondary | ICD-10-CM

## 2017-08-03 DIAGNOSIS — M4802 Spinal stenosis, cervical region: Secondary | ICD-10-CM | POA: Diagnosis not present

## 2017-08-03 DIAGNOSIS — S199XXA Unspecified injury of neck, initial encounter: Secondary | ICD-10-CM | POA: Diagnosis not present

## 2017-08-03 MED ORDER — IOPAMIDOL (ISOVUE-M 300) INJECTION 61%
10.0000 mL | Freq: Once | INTRAMUSCULAR | Status: AC | PRN
  Administered 2017-08-03: 10 mL via INTRATHECAL

## 2017-08-03 MED ORDER — DIAZEPAM 5 MG PO TABS
5.0000 mg | ORAL_TABLET | Freq: Once | ORAL | Status: AC
  Administered 2017-08-03: 5 mg via ORAL

## 2017-08-03 MED ORDER — ONDANSETRON HCL 4 MG/2ML IJ SOLN: 4.0000 mg | Freq: Four times a day (QID) | INTRAMUSCULAR | Status: DC | PRN

## 2017-08-03 NOTE — Discharge Instructions (Signed)

## 2017-09-09 ENCOUNTER — Other Ambulatory Visit: Payer: BLUE CROSS/BLUE SHIELD

## 2017-09-09 DIAGNOSIS — M542 Cervicalgia: Secondary | ICD-10-CM | POA: Diagnosis not present

## 2017-09-09 DIAGNOSIS — M5412 Radiculopathy, cervical region: Secondary | ICD-10-CM | POA: Diagnosis not present

## 2018-06-05 ENCOUNTER — Encounter (HOSPITAL_COMMUNITY): Payer: Self-pay

## 2018-06-05 ENCOUNTER — Ambulatory Visit (HOSPITAL_COMMUNITY)
Admission: EM | Admit: 2018-06-05 | Discharge: 2018-06-05 | Disposition: A | Discharge status: Home / Self Care | Payer: BLUE CROSS/BLUE SHIELD | Attending: Family Medicine | Admitting: Family Medicine

## 2018-06-05 DIAGNOSIS — R51 Headache: Secondary | ICD-10-CM | POA: Diagnosis not present

## 2018-06-05 DIAGNOSIS — S161XXA Strain of muscle, fascia and tendon at neck level, initial encounter: Secondary | ICD-10-CM | POA: Diagnosis not present

## 2018-06-05 DIAGNOSIS — R519 Headache, unspecified: Secondary | ICD-10-CM

## 2018-06-05 MED ORDER — DICLOFENAC SODIUM 75 MG PO TBEC: 75.0000 mg | DELAYED_RELEASE_TABLET | Freq: Two times a day (BID) | ORAL | 0 refills | Status: DC

## 2018-06-05 MED ORDER — CYCLOBENZAPRINE HCL 10 MG PO TABS: ORAL_TABLET | ORAL | 0 refills | Status: DC
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
## 2018-06-05 NOTE — Discharge Instructions (Addendum)
HOME CARE INSTRUCTIONS: For many people, back pain returns. Since low back pain is rarely dangerous, it is often a condition that people can learn to manage on their own. Please remain active. It is stressful on the back to sit or stand in one place. Do not sit, drive, or stand in one place for more than 30 minutes at a time. Take short walks on level surfaces as soon as pain allows. Try to increase the length of time you walk each day. Do not stay in bed. Resting more than 1 or 2 days can delay your recovery. Do not avoid exercise or work. Your body is made to move. It is not dangerous to be active, even though your back may hurt. Your back will likely heal faster if you return to being active before your pain is gone. Over-the-counter medicines to reduce pain and inflammation are often the most helpful.  SEEK MEDICAL CARE IF: You have pain that is not relieved with rest or medicine. You have pain that does not improve in 1 week. You have new symptoms. You are generally not feeling well.  SEEK IMMEDIATE MEDICAL CARE IF: You have pain that radiates from your back into your legs. You develop new bowel or bladder control problems. You have unusual weakness or numbness in your arms or legs. You develop nausea or vomiting. You develop abdominal pain. You feel faint.  Regarding your headache: Please seek prompt medical care if: You have: A very bad (severe) headache that is not helped by medicine. Trouble walking or weakness in your arms and legs. Clear or bloody fluid coming from your nose or ears. Changes in your seeing (vision). Jerky movements that you cannot control (seizure). You throw up (vomit). Your symptoms get worse. You lose balance. Your speech is slurred. You pass out. You are sleepier and have trouble staying awake. The black centers of your eyes (pupils) change in size.  These symptoms may be an emergency. Do not wait to see if the symptoms will go away. Get medical help  right away. Call your local emergency services. Do not drive yourself to the hospital.
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
## 2018-06-14 ENCOUNTER — Ambulatory Visit (HOSPITAL_COMMUNITY)
Admission: EM | Admit: 2018-06-14 | Discharge: 2018-06-14 | Disposition: A | Discharge status: Home / Self Care | Payer: BLUE CROSS/BLUE SHIELD | Attending: Family Medicine | Admitting: Family Medicine

## 2018-06-14 ENCOUNTER — Ambulatory Visit (INDEPENDENT_AMBULATORY_CARE_PROVIDER_SITE_OTHER): Payer: BLUE CROSS/BLUE SHIELD

## 2018-06-14 ENCOUNTER — Encounter (HOSPITAL_COMMUNITY): Payer: Self-pay | Admitting: Emergency Medicine

## 2018-06-14 DIAGNOSIS — M25511 Pain in right shoulder: Secondary | ICD-10-CM
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
## 2019-03-28 ENCOUNTER — Encounter (HOSPITAL_COMMUNITY): Payer: Self-pay | Admitting: Emergency Medicine

## 2019-03-28 ENCOUNTER — Encounter (HOSPITAL_COMMUNITY)
Admission: EM | Disposition: A | Discharge status: Home / Self Care | Payer: Self-pay | Source: Home / Self Care | Attending: Emergency Medicine

## 2019-03-28 ENCOUNTER — Emergency Department (HOSPITAL_COMMUNITY): Payer: No Typology Code available for payment source

## 2019-03-28 ENCOUNTER — Emergency Department (HOSPITAL_COMMUNITY): Payer: No Typology Code available for payment source | Admitting: Anesthesiology

## 2019-03-28 ENCOUNTER — Other Ambulatory Visit: Payer: Self-pay

## 2019-03-28 ENCOUNTER — Ambulatory Visit (HOSPITAL_COMMUNITY)
Admission: EM | Admit: 2019-03-28 | Discharge: 2019-03-28 | Disposition: A | Discharge status: Home / Self Care | Payer: No Typology Code available for payment source | Attending: Emergency Medicine | Admitting: Emergency Medicine

## 2019-03-28 DIAGNOSIS — F17210 Nicotine dependence, cigarettes, uncomplicated: Secondary | ICD-10-CM | POA: Insufficient documentation

## 2019-03-28 DIAGNOSIS — S62639B Displaced fracture of distal phalanx of unspecified finger, initial encounter for open fracture: Secondary | ICD-10-CM

## 2019-03-28 DIAGNOSIS — S62663B Nondisplaced fracture of distal phalanx of left middle finger, initial encounter for open fracture: Secondary | ICD-10-CM | POA: Diagnosis not present

## 2019-03-28 DIAGNOSIS — Z03818 Encounter for observation for suspected exposure to other biological agents ruled out: Secondary | ICD-10-CM | POA: Diagnosis not present

## 2019-03-28 DIAGNOSIS — Y990 Civilian activity done for income or pay: Secondary | ICD-10-CM | POA: Diagnosis not present

## 2019-03-28 DIAGNOSIS — S67193A Crushing injury of left middle finger, initial encounter: Secondary | ICD-10-CM | POA: Insufficient documentation

## 2019-03-28 DIAGNOSIS — Z791 Long term (current) use of non-steroidal anti-inflammatories (NSAID): Secondary | ICD-10-CM | POA: Diagnosis not present

## 2019-03-28 DIAGNOSIS — Z20828 Contact with and (suspected) exposure to other viral communicable diseases: Secondary | ICD-10-CM | POA: Diagnosis not present

## 2019-03-28 DIAGNOSIS — W230XXA Caught, crushed, jammed, or pinched between moving objects, initial encounter: Secondary | ICD-10-CM | POA: Diagnosis not present

## 2019-03-28 HISTORY — PX: I&D EXTREMITY: SHX5045

## 2019-03-28 LAB — RESPIRATORY PANEL BY RT PCR (FLU A&B, COVID)
Influenza A by PCR: NEGATIVE
Influenza B by PCR: NEGATIVE
SARS Coronavirus 2 by RT PCR: NEGATIVE

## 2019-03-28 SURGERY — IRRIGATION AND DEBRIDEMENT EXTREMITY
Anesthesia: General | Site: Finger | Laterality: Left

## 2019-03-28 MED ORDER — TETANUS-DIPHTH-ACELL PERTUSSIS 5-2.5-18.5 LF-MCG/0.5 IM SUSP
0.5000 mL | Freq: Once | INTRAMUSCULAR | Status: AC
  Administered 2019-03-28: 0.5 mL via INTRAMUSCULAR
  Filled 2019-03-28: qty 0.5

## 2019-03-28 MED ORDER — ONDANSETRON HCL 4 MG/2ML IJ SOLN
INTRAMUSCULAR | Status: DC | PRN
  Administered 2019-03-28: 4 mg via INTRAVENOUS

## 2019-03-28 MED ORDER — DEXAMETHASONE SODIUM PHOSPHATE 10 MG/ML IJ SOLN
INTRAMUSCULAR | Status: DC | PRN
  Administered 2019-03-28: 10 mg via INTRAVENOUS

## 2019-03-28 MED ORDER — FENTANYL CITRATE (PF) 100 MCG/2ML IJ SOLN
INTRAMUSCULAR | Status: DC | PRN
  Administered 2019-03-28: 50 ug via INTRAVENOUS

## 2019-03-28 MED ORDER — BUPIVACAINE HCL (PF) 0.25 % IJ SOLN
INTRAMUSCULAR | Status: DC | PRN
  Administered 2019-03-28: 5 mL

## 2019-03-28 MED ORDER — CEPHALEXIN 500 MG PO CAPS: 500.0000 mg | ORAL_CAPSULE | Freq: Four times a day (QID) | ORAL | 0 refills | Status: AC

## 2019-03-28 MED ORDER — 0.9 % SODIUM CHLORIDE (POUR BTL) OPTIME
TOPICAL | Status: DC | PRN
  Administered 2019-03-28: 19:00:00 1000 mL

## 2019-03-28 MED ORDER — PROPOFOL 10 MG/ML IV BOLUS
INTRAVENOUS | Status: AC
  Filled 2019-03-28: qty 20

## 2019-03-28 MED ORDER — FENTANYL CITRATE (PF) 250 MCG/5ML IJ SOLN
INTRAMUSCULAR | Status: AC
  Filled 2019-03-28: qty 5

## 2019-03-28 MED ORDER — CHLORHEXIDINE GLUCONATE 4 % EX LIQD: 60.0000 mL | Freq: Once | CUTANEOUS | Status: DC

## 2019-03-28 MED ORDER — MIDAZOLAM HCL 5 MG/5ML IJ SOLN
INTRAMUSCULAR | Status: DC | PRN
  Administered 2019-03-28: 2 mg via INTRAVENOUS

## 2019-03-28 MED ORDER — CEFAZOLIN SODIUM-DEXTROSE 2-4 GM/100ML-% IV SOLN
2.0000 g | Freq: Once | INTRAVENOUS | Status: AC
  Administered 2019-03-28: 2 g via INTRAVENOUS
  Filled 2019-03-28: qty 100

## 2019-03-28 MED ORDER — LACTATED RINGERS IV SOLN
INTRAVENOUS | Status: DC
  Administered 2019-03-28: 18:00:00 via INTRAVENOUS

## 2019-03-28 MED ORDER — PROPOFOL 10 MG/ML IV BOLUS
INTRAVENOUS | Status: DC | PRN
  Administered 2019-03-28: 20 mg via INTRAVENOUS
  Administered 2019-03-28: 180 mg via INTRAVENOUS

## 2019-03-28 MED ORDER — MIDAZOLAM HCL 2 MG/2ML IJ SOLN
INTRAMUSCULAR | Status: AC
  Filled 2019-03-28: qty 2

## 2019-03-28 MED ORDER — CEFAZOLIN SODIUM-DEXTROSE 2-4 GM/100ML-% IV SOLN
2.0000 g | INTRAVENOUS | Status: AC
  Administered 2019-03-28: 19:00:00 2 g via INTRAVENOUS
  Filled 2019-03-28: qty 100

## 2019-03-28 MED ORDER — SODIUM CHLORIDE 0.9 % IR SOLN
Status: DC | PRN
  Administered 2019-03-28: 3000 mL

## 2019-03-28 MED ORDER — CEFAZOLIN SODIUM-DEXTROSE 2-4 GM/100ML-% IV SOLN: 2.0000 g | INTRAVENOUS | Status: DC

## 2019-03-28 MED ORDER — LACTATED RINGERS IV SOLN
INTRAVENOUS | Status: DC | PRN
  Administered 2019-03-28: 19:00:00 via INTRAVENOUS

## 2019-03-28 MED ORDER — POVIDONE-IODINE 10 % EX SWAB: 2.0000 "application " | Freq: Once | CUTANEOUS | Status: DC

## 2019-03-28 MED ORDER — OXYCODONE-ACETAMINOPHEN 5-325 MG PO TABS: 1.0000 | ORAL_TABLET | ORAL | 0 refills | Status: AC | PRN

## 2019-03-28 SURGICAL SUPPLY — 58 items
BNDG COHESIVE 1X5 TAN STRL LF (GAUZE/BANDAGES/DRESSINGS) ×3 IMPLANT
BNDG CONFORM 2 STRL LF (GAUZE/BANDAGES/DRESSINGS) ×3 IMPLANT
BNDG ELASTIC 3X5.8 VLCR STR LF (GAUZE/BANDAGES/DRESSINGS) ×3 IMPLANT
BNDG ELASTIC 4X5.8 VLCR STR LF (GAUZE/BANDAGES/DRESSINGS) ×3 IMPLANT
BNDG ESMARK 4X9 LF (GAUZE/BANDAGES/DRESSINGS) ×3 IMPLANT
BNDG GAUZE ELAST 4 BULKY (GAUZE/BANDAGES/DRESSINGS) ×3 IMPLANT
CORD BIPOLAR FORCEPS 12FT (ELECTRODE) ×3 IMPLANT
COVER SURGICAL LIGHT HANDLE (MISCELLANEOUS) ×3 IMPLANT
COVER WAND RF STERILE (DRAPES) ×3 IMPLANT
CUFF TOURN SGL QUICK 18X4 (TOURNIQUET CUFF) ×3 IMPLANT
CUFF TOURN SGL QUICK 24 (TOURNIQUET CUFF)
CUFF TRNQT CYL 24X4X16.5-23 (TOURNIQUET CUFF) IMPLANT
DRAIN PENROSE 1/4X12 LTX STRL (WOUND CARE) IMPLANT
DRAPE SURG 17X23 STRL (DRAPES) ×3 IMPLANT
DRSG ADAPTIC 3X8 NADH LF (GAUZE/BANDAGES/DRESSINGS) ×3 IMPLANT
DRSG EMULSION OIL 3X3 NADH (GAUZE/BANDAGES/DRESSINGS) ×3 IMPLANT
ELECT REM PT RETURN 9FT ADLT (ELECTROSURGICAL)
ELECTRODE REM PT RTRN 9FT ADLT (ELECTROSURGICAL) IMPLANT
GAUZE SPONGE 2X2 8PLY STRL LF (GAUZE/BANDAGES/DRESSINGS) ×1 IMPLANT
GAUZE SPONGE 4X4 12PLY STRL (GAUZE/BANDAGES/DRESSINGS) ×3 IMPLANT
GAUZE XEROFORM 1X8 LF (GAUZE/BANDAGES/DRESSINGS) ×3 IMPLANT
GAUZE XEROFORM 5X9 LF (GAUZE/BANDAGES/DRESSINGS) IMPLANT
GLOVE BIOGEL PI IND STRL 8.5 (GLOVE) ×1 IMPLANT
GLOVE BIOGEL PI INDICATOR 8.5 (GLOVE) ×2
GLOVE SURG ORTHO 8.0 STRL STRW (GLOVE) ×3 IMPLANT
GOWN STRL REUS W/ TWL LRG LVL3 (GOWN DISPOSABLE) ×3 IMPLANT
GOWN STRL REUS W/ TWL XL LVL3 (GOWN DISPOSABLE) ×1 IMPLANT
GOWN STRL REUS W/TWL LRG LVL3 (GOWN DISPOSABLE) ×6
GOWN STRL REUS W/TWL XL LVL3 (GOWN DISPOSABLE) ×2
HANDPIECE INTERPULSE COAX TIP (DISPOSABLE)
KIT BASIN OR (CUSTOM PROCEDURE TRAY) ×3 IMPLANT
KIT TURNOVER KIT B (KITS) ×3 IMPLANT
MANIFOLD NEPTUNE II (INSTRUMENTS) ×3 IMPLANT
NEEDLE HYPO 25GX1X1/2 BEV (NEEDLE) IMPLANT
NS IRRIG 1000ML POUR BTL (IV SOLUTION) ×3 IMPLANT
PACK ORTHO EXTREMITY (CUSTOM PROCEDURE TRAY) ×3 IMPLANT
PAD ARMBOARD 7.5X6 YLW CONV (MISCELLANEOUS) ×6 IMPLANT
PAD CAST 4YDX4 CTTN HI CHSV (CAST SUPPLIES) ×1 IMPLANT
PADDING CAST COTTON 4X4 STRL (CAST SUPPLIES) ×2
SET CYSTO W/LG BORE CLAMP LF (SET/KITS/TRAYS/PACK) IMPLANT
SET HNDPC FAN SPRY TIP SCT (DISPOSABLE) IMPLANT
SOAP 2 % CHG 4 OZ (WOUND CARE) ×3 IMPLANT
SPONGE GAUZE 2X2 STER 10/PKG (GAUZE/BANDAGES/DRESSINGS) ×2
SPONGE LAP 18X18 RF (DISPOSABLE) ×3 IMPLANT
SPONGE LAP 4X18 RFD (DISPOSABLE) ×3 IMPLANT
SUT ETHILON 4 0 PS 2 18 (SUTURE) IMPLANT
SUT ETHILON 5 0 P 3 18 (SUTURE)
SUT NYLON ETHILON 5-0 P-3 1X18 (SUTURE) IMPLANT
SWAB COLLECTION DEVICE MRSA (MISCELLANEOUS) ×3 IMPLANT
SWAB CULTURE ESWAB REG 1ML (MISCELLANEOUS) IMPLANT
SYR CONTROL 10ML LL (SYRINGE) IMPLANT
TOWEL GREEN STERILE (TOWEL DISPOSABLE) ×3 IMPLANT
TOWEL GREEN STERILE FF (TOWEL DISPOSABLE) ×3 IMPLANT
TUBE CONNECTING 12'X1/4 (SUCTIONS) ×1
TUBE CONNECTING 12X1/4 (SUCTIONS) ×2 IMPLANT
UNDERPAD 30X30 (UNDERPADS AND DIAPERS) ×3 IMPLANT
WATER STERILE IRR 1000ML POUR (IV SOLUTION) ×3 IMPLANT
YANKAUER SUCT BULB TIP NO VENT (SUCTIONS) ×3 IMPLANT

## 2019-03-28 NOTE — ED Provider Notes (Signed)
[facility] Provider Note   CSN: [id number] Arrival date & time: 03/28/19  9798     History   Chief Complaint Chief Complaint  Patient presents with  . Finger Injury    HPI Noah Smith is a 53 y.o. male  Without significant PMHx presenting to the ED with complaint of sudden onset of left 3rd digit injury that occurred prior to arrival.  [facility] for further evaluation. Pt reports his finger was crushed by a roll compressor machine. He is having minimal pain, 2/10. However, he sustained a wound to the tip of his finger. No interventions done prior to arrival. Last tetanus is unknown. No known hx immunocompromise.     The history is provided by the patient.    History reviewed. No pertinent past medical history.  There are no active problems to display for this patient.   Past Surgical History:  Procedure Laterality Date  . HERNIA REPAIR    . NECK SURGERY          Home Medications    Prior to Admission medications   Medication Sig Start Date End Date Taking? Authorizing Provider  cyclobenzaprine (FLEXERIL) 10 MG tablet Take 1 tablet by mouth 3 times daily as needed for muscle spasm. Warning: May cause drowsiness. 06/05/18   Vanessa Kick, MD  diclofenac (VOLTAREN) 75 MG EC tablet Take 1 tablet (75 mg total) by mouth 2 (two) times daily. 06/05/18   Vanessa Kick, MD    Family History Family History  Problem Relation Age of Onset  . Cancer Father     Social History Social History   Tobacco Use  . Smoking status: Current Every Day Smoker    Packs/day: 0.50    Types: Cigarettes  . Smokeless tobacco: Never Used  Substance Use Topics  . Alcohol use: Yes  . Drug use: No     Allergies   Patient has no known allergies.   Review of Systems Review of Systems  Musculoskeletal: Positive for arthralgias.  Skin: Positive for wound.  Allergic/Immunologic: Negative for  immunocompromised state.  All other systems reviewed and are negative.    Physical Exam Updated Vital Signs BP (!) 148/104 (BP Location: Right Arm)   Pulse 65   Temp 98.2 F (36.8 C) (Oral)   Resp 16   Ht 6' (1.829 m)   Wt 77.1 kg   SpO2 100%   BMI 23.06 kg/m   Physical Exam Vitals signs and nursing note reviewed.  Constitutional:      Appearance: He is well-developed.  HENT:     Head: Normocephalic and atraumatic.  Eyes:     Conjunctiva/sclera: Conjunctivae normal.  Cardiovascular:     Rate and Rhythm: Normal rate.  Pulmonary:     Effort: Pulmonary effort is normal.  Abdominal:     Palpations: Abdomen is soft.  Musculoskeletal:     Comments: Left third digit with extensive nailbed injury which jagged laceration.  The entirety of the nail is missing.  There is also partial avulsion to the fingertip.  Not grossly contaminated, hemostasis achieved with direct pressure.  Patient is able to range the digit without difficulty.  See image below.  Skin:    General: Skin is warm.  Neurological:     Mental Status: He is alert.  Psychiatric:        Behavior: Behavior normal.            ED Treatments /  Results  Labs (all labs ordered are listed, but only abnormal results are displayed) Labs Reviewed  RESPIRATORY PANEL BY RT PCR (FLU A&B, COVID)    EKG None  Radiology Dg Hand Complete Left  Result Date: 03/28/2019 CLINICAL DATA:  Crush injury of the left long finger on a piece of equipment today. Initial encounter. EXAM: LEFT HAND - COMPLETE 3+ VIEW COMPARISON:  None. FINDINGS: Nondisplaced fracture of the tip finger is identified. Bandaging is in place of the tuft of the long. No radiopaque foreign body is identified. No other bony abnormality. IMPRESSION: Acute fracture of the tip of the tuft of the left long finger. Electronically Signed   By: Drusilla Kanner M.D.   On: 03/28/2019 11:15    Procedures Procedures (including critical care time)  Medications  Ordered in ED Medications  chlorhexidine (HIBICLENS) 4 % liquid 4 application (has no administration in time range)  povidone-iodine 10 % swab 2 application (has no administration in time range)  lactated ringers infusion ( Intravenous New Bag/Given 03/28/19 1751)  ceFAZolin (ANCEF) IVPB 2g/100 mL premix (has no administration in time range)  Tdap (BOOSTRIX) injection 0.5 mL (0.5 mLs Intramuscular Given 03/28/19 1341)  ceFAZolin (ANCEF) IVPB 2g/100 mL premix (0 g Intravenous Stopped 03/28/19 1627)  propofol (DIPRIVAN) 10 mg/mL bolus/IV push (has no administration in time range)  fentaNYL (SUBLIMAZE) 250 MCG/5ML injection (has no administration in time range)  midazolam (VERSED) 2 MG/2ML injection (has no administration in time range)     Initial Impression / Assessment and Plan / ED Course  I have reviewed the triage vital signs and the nursing notes.  Pertinent labs & imaging results that were available during my care of the patient were reviewed by me and considered in my medical decision making (see chart for details).  Clinical Course as of Mar 27 1820  Wed Mar 28, 2019  1247 Ortho Georgia Farris Has to evaluate pt at bedside.   [JR]    Clinical Course User Index [JR] Azizah Lisle, [place] N, PA-C       Pt with nail bed injury and open tuft fracture of the left 3rd digiti that occurred at work today. Pt had his finger crushed in a roll compressor machine. Ortho PA consulted, pt to go to the OR this evening for repair. Prophylactic abx administered, tdap updated. Pt without pain currently, though was offered digital block vs PO medication. Declined at this time.   Final Clinical Impressions(s) / ED Diagnoses   Final diagnoses:  Open fracture of tuft of distal phalanx of finger    ED Discharge Orders    None       Rashawn Rolon, [place] N, PA-C 03/28/19 1821    Derwood Kaplan, MD 03/30/19 1927

## 2019-03-28 NOTE — Consult Note (Addendum)
Reason for Consult:Left long finger injury Referring Physician: [name] is an 53 y.o. male.  HPI: Noah Smith Smith was working and got his left long finger caught in Noah Smith Smith. He had Noah Smith pretty significant injury and came to the ED for evaluation. An open tuft fx and mangled digit were appreciated and hand surgery was consulted. He is RHD.  History reviewed. No pertinent past medical history.  Past Surgical History:  Procedure Laterality Date  . HERNIA REPAIR    . NECK SURGERY      Family History  Problem Relation Age of Onset  . Cancer Father     Social History:  reports that he has been smoking cigarettes. He has been smoking about 0.50 packs per day. He has never used smokeless tobacco. He reports current alcohol use. He reports that he does not use drugs.  Allergies: No Known Allergies  Medications: I have reviewed the patient's current medications.  No results found for this or any previous visit (from the past 48 hour(s)).  Dg Hand Complete Left  Result Date: 03/28/2019 CLINICAL DATA:  Crush injury of the left long finger on Noah Smith piece of equipment today. Initial encounter. EXAM: LEFT HAND - COMPLETE 3+ VIEW COMPARISON:  None. FINDINGS: Nondisplaced fracture of the tip finger is identified. Bandaging is in place of the tuft of the long. No radiopaque foreign body is identified. No other bony abnormality. IMPRESSION: Acute fracture of the tip of the tuft of the left long finger. Electronically Signed   By: Drusilla Kanner M.D.   On: 03/28/2019 11:15    Review of Systems  Constitutional: Negative for weight loss.  HENT: Negative for ear discharge, ear pain, hearing loss and tinnitus.   Eyes: Negative for blurred vision, double vision, photophobia and pain.  Respiratory: Negative for cough, sputum production and shortness of breath.   Cardiovascular: Negative for chest pain.  Gastrointestinal: Negative for abdominal pain, nausea and vomiting.  Genitourinary:  Negative for dysuria, flank pain, frequency and urgency.  Musculoskeletal: Positive for joint pain (Left long finger). Negative for back pain, falls, myalgias and neck pain.  Neurological: Negative for dizziness, tingling, sensory change, focal weakness, loss of consciousness and headaches.  Endo/Heme/Allergies: Does not bruise/bleed easily.  Psychiatric/Behavioral: Negative for depression, memory loss and substance abuse. The patient is not nervous/anxious.    Blood pressure (!) 148/104, pulse 65, temperature 98.2 F (36.8 C), temperature source Oral, resp. rate 16, SpO2 100 %. Physical Exam  Constitutional: He appears well-developed and well-nourished. No distress.  HENT:  Head: Normocephalic and atraumatic.  Eyes: Conjunctivae are normal. Right eye exhibits no discharge. Left eye exhibits no discharge. No scleral icterus.  Neck: Normal range of motion.  Cardiovascular: Normal rate and regular rhythm.  Respiratory: Effort normal. No respiratory distress.  Musculoskeletal:     Comments: Left shoulder, elbow, wrist, digits- Mangled long finger P3, no instability, no blocks to motion  Sens  Ax/R/M/U intact  Mot   Ax/ R/ PIN/ M/ AIN/ U intact  Rad 2+  Neurological: He is alert.  Skin: Skin is warm and dry. He is not diaphoretic.  Psychiatric: He has Noah Smith normal mood and affect. His behavior is normal.    Assessment/Plan: Left long finger injury -- Plan amputation revision this evening by Dr. Melvyn Novas. NPO until then. Anticipate discharge after surgery.  The patient was seen and examined I agree with the above findings Care was discussed with the patient. Plan for revision debridement and repair of  the left long finger Patient voiced understanding of plan.  R/B/Noah Smith DISCUSSED WITH PT IN OFFICE.  PT VOICED UNDERSTANDING OF PLAN CONSENT SIGNED DAY OF SURGERY PT SEEN AND EXAMINED PRIOR TO OPERATIVE PROCEDURE/DAY OF SURGERY SITE MARKED. QUESTIONS ANSWERED WILL GO HOME FOLLOWING  SURGERY  Kambria Grima [employer] MD 03/28/19  Lisette Abu, PA-C Orthopedic Surgery [phone] 03/28/2019, 1:17 PM

## 2019-03-28 NOTE — Anesthesia Preprocedure Evaluation (Addendum)
Anesthesia Evaluation  Patient identified by MRN, date of birth, ID band Patient awake    Reviewed: Allergy & Precautions, NPO status , Patient's Chart, lab work & pertinent test results  History of Anesthesia Complications Negative for: history of anesthetic complications  Airway Mallampati: II  TM Distance: >3 FB Neck ROM: Full    Dental  (+) Dental Advisory Given   Pulmonary neg recent URI, Current SmokerPatient did not abstain from smoking.,    breath sounds clear to auscultation       Cardiovascular negative cardio ROS   Rhythm:Regular     Neuro/Psych negative neurological ROS  negative psych ROS   GI/Hepatic negative GI ROS, Neg liver ROS,   Endo/Other  negative endocrine ROS  Renal/GU negative Renal ROS     Musculoskeletal negative musculoskeletal ROS (+)   Abdominal   Peds  Hematology negative hematology ROS (+)   Anesthesia Other Findings  Left long finger injury  Reproductive/Obstetrics                           Anesthesia Physical Anesthesia Plan  ASA: II  Anesthesia Plan: General   Post-op Pain Management:    Induction: Intravenous  PONV Risk Score and Plan: 1 and Ondansetron and Dexamethasone  Airway Management Planned: LMA  Additional Equipment: None  Intra-op Plan:   Post-operative Plan: Extubation in OR  Informed Consent: I have reviewed the patients History and Physical, chart, labs and discussed the procedure including the risks, benefits and alternatives for the proposed anesthesia with the patient or authorized representative who has indicated his/her understanding and acceptance.     Dental advisory given  Plan Discussed with: CRNA and Surgeon  Anesthesia Plan Comments:         Anesthesia Quick Evaluation

## 2019-03-28 NOTE — ED Triage Notes (Signed)
Pt arrives to ED from home with complaints of at finger injury at work today. Patient states his left middle finger got crushed in a machine. Bleeding controlled at this time.

## 2019-03-28 NOTE — ED Notes (Signed)
Report given to short stay RN  

## 2019-03-28 NOTE — Op Note (Signed)
PREOPERATIVE DIAGNOSIS: Left long finger open distal phalanx fracture  POSTOPERATIVE DIAGNOSIS: Same  ATTENDING SURGEON: Dr. Iran Planas who scrubbed and present for the entire procedure  ASSISTANT SURGEON: None  ANESTHESIA: General via laryngeal mask airway  OPERATIVE PROCEDURE: #1: Open debridement of skin subcutaneous tissue and bone associated with left long finger open distal phalanx fracture #2: Open treatment of left long finger distal phalanx fracture without internal fixation #3: Left long finger nailbed repair #4: Left long finger advancement flap closure VY closure  IMPLANTS: None  RADIOGRAPHIC INTERPRETATION: None  SURGICAL INDICATIONS: Patient is a right-hand-dominant gentleman who sustained the open left long finger distal phalanx fracture.  Patient was seen and evaluated in the emergency department.  Given the soft tissue injury and open nature of the fracture is recommended the patient undergo the above procedure.  Risk benefits alternatives discussed in detail the patient and a signed informed consent was obtained.  SURGICAL TECHNIQUE: Patient was palpated find the preoperative holding area marked for marker made on the left long finger to indicate the correct operative site.  Patient brought back operating placed supine on anesthesia table where the general anesthetic was administered.  Patient tolerates well.  Well-padded tourniquet was then placed on the left brachium so the appropriate drape.  Left upper extremities and prepped and draped normal sterile fashion.  A timeout was called the correct site was identified the procedure then begun.  Attention then turned to the left long finger open debridement of skin subcutaneous tissue and bone was then carried out with sharp scissors knives and rongeurs removing the devitalized tissue.  Portion of the bone fragments were then removed.  Open treatment of the distal phalanx fracture was done without internal fixation.  Open  debridement was done and then thorough wound irrigation was done to the open area.  Following this portion of the nailbed that was still intact did have a stellate laceration and this was repaired with simple chromic sutures.  The patient did have the defect along the ulnar margin of the finger and a VY Cutler advancement flap was then carried out in order to advance the soft tissues and close this defect.  The skin was then closed with simple chromic sutures.  Adaptic dressings and a sterile compressive bandage then applied.  The patient tolerated the procedure well tourniquet was deflated with good perfusion of the finger 5 cc of quarter percent Marcaine infiltrated locally.  Adaptic dressing a sterile compressive bandage then applied.  Patient tolerated procedure well.  POSTOPERATIVE PLAN: Patient be discharged to home.  See him back in the office in 8 days for wound check down to see our therapist for tip protector splint.  Radiographs at the first visit.  Gradual use and activity as the wound heals.

## 2019-03-28 NOTE — Discharge Instructions (Signed)
KEEP BANDAGE CLEAN AND DRY °CALL OFFICE FOR F/U APPT [phone] in 8 days °KEEP HAND ELEVATED ABOVE HEART °OK TO APPLY ICE TO OPERATIVE AREA °CONTACT OFFICE IF ANY WORSENING PAIN OR CONCERNS. °

## 2019-03-28 NOTE — Transfer of Care (Signed)
Immediate Anesthesia Transfer of Care Note  Patient: Noah Smith  Procedure(s) Performed: REVISION AMPUTATION LEFT LONG FINGER (Left Finger)  Patient Location: PACU  Anesthesia Type:General  Level of Consciousness: awake  Airway & Oxygen Therapy: Patient Spontanous Breathing  Post-op Assessment: Report given to RN and Post -op Vital signs reviewed and stable  Post vital signs: stable  Last Vitals:  Vitals Value Taken Time  BP 116/81 03/28/19 1952  Temp 35.6 C 03/28/19 1951  Pulse 53 03/28/19 1958  Resp 12 03/28/19 1958  SpO2 98 % 03/28/19 1958  Vitals shown include unvalidated device data.  Last Pain:  Vitals:   03/28/19 1951  TempSrc:   PainSc: 0-No pain      Patients Stated Pain Goal: 0 (51/02/58 5277)  Complications: No apparent anesthesia complications

## 2019-03-28 NOTE — Anesthesia Procedure Notes (Signed)
Procedure Name: LMA Insertion Date/Time: 03/28/2019 6:59 PM Performed by: Purvis Kilts, CRNA Pre-anesthesia Checklist: Patient identified, Emergency Drugs available, Suction available, Patient being monitored and Timeout performed Patient Re-evaluated:Patient Re-evaluated prior to induction Oxygen Delivery Method: Circle system utilized Preoxygenation: Pre-oxygenation with 100% oxygen Induction Type: IV induction Ventilation: Mask ventilation without difficulty LMA: LMA inserted LMA Size: 5.0 Number of attempts: 1 Placement Confirmation: positive ETCO2 and breath sounds checked- equal and bilateral Tube secured with: Tape Dental Injury: Teeth and Oropharynx as per pre-operative assessment

## 2019-04-04 NOTE — Anesthesia Postprocedure Evaluation (Signed)
Anesthesia Post Note  Patient: Noah Smith  Procedure(s) Performed: REVISION AMPUTATION LEFT LONG FINGER (Left Finger)     Patient location during evaluation: PACU Anesthesia Type: General Level of consciousness: awake and alert Pain management: pain level controlled Vital Signs Assessment: post-procedure vital signs reviewed and stable Respiratory status: spontaneous breathing, nonlabored ventilation, respiratory function stable and patient connected to nasal cannula oxygen Cardiovascular status: blood pressure returned to baseline and stable Postop Assessment: no apparent nausea or vomiting Anesthetic complications: no    Last Vitals:  Vitals:   03/28/19 2015 03/28/19 2030  BP: 123/81 118/67  Pulse: 62 (!) 56  Resp: 16 15  Temp:  36.4 C  SpO2: 100% 99%    Last Pain:  Vitals:   03/28/19 2030  TempSrc:   PainSc: 0-No pain                 Yohann Curl

## 2020-01-04 ENCOUNTER — Encounter (HOSPITAL_COMMUNITY): Payer: Self-pay

## 2020-01-04 ENCOUNTER — Other Ambulatory Visit: Payer: Self-pay

## 2020-01-04 ENCOUNTER — Ambulatory Visit (HOSPITAL_COMMUNITY)
Admission: EM | Admit: 2020-01-04 | Discharge: 2020-01-04 | Disposition: A | Discharge status: Home / Self Care | Payer: BC Managed Care – PPO | Attending: Emergency Medicine | Admitting: Emergency Medicine

## 2020-01-04 DIAGNOSIS — Z20822 Contact with and (suspected) exposure to covid-19: Secondary | ICD-10-CM | POA: Insufficient documentation

## 2020-01-04 DIAGNOSIS — R0981 Nasal congestion: Secondary | ICD-10-CM | POA: Diagnosis not present

## 2020-01-04 DIAGNOSIS — R432 Parageusia: Secondary | ICD-10-CM | POA: Insufficient documentation

## 2020-01-04 LAB — SARS CORONAVIRUS 2 (TAT 6-24 HRS): SARS Coronavirus 2: POSITIVE — AB

## 2020-01-04 NOTE — ED Provider Notes (Signed)
[facility]    CSN: [id number] Arrival date & time: 01/04/20  1102      History   Chief Complaint Chief Complaint  Patient presents with  . COVID test  . Nasal Congestion    HPI Noah Smith is a 54 y.o. male.   Noah Smith presents with complaints of Headache which started 9/13, Nasal congestion the following day. Loss of taste yesterday. Reqeusting covid testing. No cough. No gi symptoms. No fevers. Wife found out yesterday she tested positive for covid-19, has been ill the past few days. No history of covid-19 and has not received vaccination.    ROS per HPI, negative if not otherwise mentioned.      History reviewed. No pertinent past medical history.  There are no problems to display for this patient.   Past Surgical History:  Procedure Laterality Date  . HERNIA REPAIR    . I & D EXTREMITY Left 03/28/2019   Procedure: REVISION AMPUTATION LEFT LONG FINGER;  Surgeon: Bradly Bienenstock, MD;  Location: [facility] OR;  Service: Orthopedics;  Laterality: Left;  . NECK SURGERY         Home Medications    Prior to Admission medications   Medication Sig Start Date End Date Taking? Authorizing Provider  cyclobenzaprine (FLEXERIL) 10 MG tablet Take 1 tablet by mouth 3 times daily as needed for muscle spasm. Warning: May cause drowsiness. 06/05/18   Mardella Layman, MD    Family History Family History  Problem Relation Age of Onset  . Cancer Father     Social History Social History   Tobacco Use  . Smoking status: Current Every Day Smoker    Packs/day: 0.50    Types: Cigarettes  . Smokeless tobacco: Never Used  Substance Use Topics  . Alcohol use: Yes  . Drug use: No     Allergies   Patient has no known allergies.   Review of Systems Review of Systems   Physical Exam Triage Vital Signs ED Triage Vitals  Enc Vitals Group     BP 01/04/20 1254 130/77     Pulse Rate 01/04/20 1254 64     Resp 01/04/20 1254 18     Temp 01/04/20 1254 98 F  (36.7 C)     Temp Source 01/04/20 1254 Oral     SpO2 01/04/20 1254 100 %     Weight --      Height --      Head Circumference --      Peak Flow --      Pain Score 01/04/20 1253 0     Pain Loc --      Pain Edu? --      Excl. in GC? --    No data found.  Updated Vital Signs BP 130/77 (BP Location: Right Arm)   Pulse 64   Temp 98 F (36.7 C) (Oral)   Resp 18   SpO2 100%   Visual Acuity Right Eye Distance:   Left Eye Distance:   Bilateral Distance:    Right Eye Near:   Left Eye Near:    Bilateral Near:     Physical Exam Constitutional:      Appearance: He is well-developed.  Cardiovascular:     Rate and Rhythm: Normal rate.  Pulmonary:     Effort: Pulmonary effort is normal.  Skin:    General: Skin is warm and dry.  Neurological:     Mental Status: He is alert and oriented to person, place,  and time.      UC Treatments / Results  Labs (all labs ordered are listed, but only abnormal results are displayed) Labs Reviewed  SARS CORONAVIRUS 2 (TAT 6-24 HRS)    EKG   Radiology No results found.  Procedures Procedures (including critical care time)  Medications Ordered in UC Medications - No data to display  Initial Impression / Assessment and Plan / UC Course  I have reviewed the triage vital signs and the nursing notes.  Pertinent labs & imaging results that were available during my care of the patient were reviewed by me and considered in my medical decision making (see chart for details).     Non toxic. Benign physical exam.  Suspicious for covid 19 with testing pending. Supportive cares recommended. Return precautions provided. Patient verbalized understanding and agreeable to plan.   Final Clinical Impressions(s) / UC Diagnoses   Final diagnoses:  Nasal congestion  Loss of taste  Exposure to COVID-19 virus     Discharge Instructions     Push fluids to ensure adequate hydration and keep secretions thin.  Tylenol and/or ibuprofen as  needed for pain or fevers.  You may try some vitamins to help your immune system potentially:  Vitamin C 500mg  twice a day. Zinc 50mg  daily. Vitamin D 5000IU daily.   If symptoms worsen or do not improve in the next week to return to be seen or to follow up with PCP.   Self isolate until covid results are back and negative.  Will notify you by phone of any positive findings. Your negative results will be sent through your MyChart.          ED Prescriptions    None     PDMP not reviewed this encounter.   , NP 01/04/20 1725

## 2020-01-04 NOTE — Discharge Instructions (Addendum)
Push fluids to ensure adequate hydration and keep secretions thin.  Tylenol and/or ibuprofen as needed for pain or fevers.  You may try some vitamins to help your immune system potentially:  Vitamin C 500mg  twice a day. Zinc 50mg  daily. Vitamin D 5000IU daily.   If symptoms worsen or do not improve in the next week to return to be seen or to follow up with PCP.   Self isolate until covid results are back and negative.  Will notify you by phone of any positive findings. Your negative results will be sent through your MyChart.

## 2020-01-04 NOTE — ED Triage Notes (Signed)
Pt presents for COVID test. States he lost the taste and smell, nasal congestion since yesterday

## 2022-10-06 ENCOUNTER — Ambulatory Visit (HOSPITAL_COMMUNITY)
Admission: EM | Admit: 2022-10-06 | Discharge: 2022-10-06 | Disposition: A | Discharge status: Home / Self Care | Payer: BC Managed Care – PPO | Attending: Physician Assistant | Admitting: Physician Assistant

## 2022-10-06 ENCOUNTER — Encounter (HOSPITAL_COMMUNITY): Payer: Self-pay | Admitting: *Deleted

## 2022-10-06 ENCOUNTER — Ambulatory Visit (INDEPENDENT_AMBULATORY_CARE_PROVIDER_SITE_OTHER): Payer: BC Managed Care – PPO

## 2022-10-06 DIAGNOSIS — M62830 Muscle spasm of back: Secondary | ICD-10-CM

## 2022-10-06 DIAGNOSIS — M19011 Primary osteoarthritis, right shoulder: Secondary | ICD-10-CM

## 2022-10-06 HISTORY — DX: Unspecified dislocation of unspecified shoulder joint, initial encounter: S43.006A

## 2022-10-06 MED ORDER — METHOCARBAMOL 500 MG PO TABS: 500.0000 mg | ORAL_TABLET | Freq: Two times a day (BID) | ORAL | 0 refills | Status: DC

## 2022-10-06 MED ORDER — MELOXICAM 15 MG PO TABS: 15.0000 mg | ORAL_TABLET | Freq: Every day | ORAL | 0 refills | Status: DC

## 2022-10-06 NOTE — ED Provider Notes (Signed)
[facility]    CSN: [id number] Arrival date & time: 10/06/22  1540      History   Chief Complaint Chief Complaint  Patient presents with   Shoulder Pain    HPI Noah Smith is a 57 y.o. male.   Patient presents today with a prolonged history (many years) of right shoulder pain.  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  He has had ongoing pain intermittently that has become more consistent in the past year and particularly bothersome in the past week.  Pain is currently rated 4 on a 0-10 pain scale but at times is much higher, worse with certain movements including overhead flexion, improved minimally with over-the-counter analgesics such as ibuprofen.  He denies any recent trauma or change in his activity that would have triggered worsening of symptoms.  He is right-handed.  Denies any numbness or paresthesias in his hand.  Denies any previous surgery involving his shoulder.    Past Medical History:  Diagnosis Date   Shoulder dislocation     There are no problems to display for this patient.   Past Surgical History:  Procedure Laterality Date   CERVICAL DISC SURGERY     HERNIA REPAIR     I & D EXTREMITY Left 03/28/2019   Procedure: REVISION AMPUTATION LEFT LONG FINGER;  Surgeon: Bradly Bienenstock, MD;  Location: [facility];  Service: Orthopedics;  Laterality: Left;   NECK SURGERY         Home Medications    Prior to Admission medications   Medication Sig Start Date End Date Taking? Authorizing Provider  meloxicam (MOBIC) 15 MG tablet Take 1 tablet (15 mg total) by mouth daily. 10/06/22  Yes Mia Milan K, PA-C  methocarbamol (ROBAXIN) 500 MG tablet Take 1 tablet (500 mg total) by mouth 2 (two) times daily. 10/06/22  Yes Layann Bluett, Noberto Retort, PA-C    Family History Family History  Problem Relation Age of Onset   Cancer Father     Social History Social History    Tobacco Use   Smoking status: Every Day    Packs/day: .5    Types: Cigarettes   Smokeless tobacco: Never  Vaping Use   Vaping Use: Never used  Substance Use Topics   Alcohol use: Yes    Comment: rarely   Drug use: Never     Allergies   Patient has no known allergies.   Review of Systems Review of Systems  Constitutional:  Positive for activity change. Negative for appetite change, fatigue and fever.  Musculoskeletal:  Positive for arthralgias and neck pain. Negative for back pain and myalgias.  Skin:  Negative for color change and wound.  Neurological:  Negative for weakness and numbness.     Physical Exam Triage Vital Signs ED Triage Vitals  Enc Vitals Group     BP 10/06/22 1558 (!) 143/89     Pulse Rate 10/06/22 1558 66     Resp 10/06/22 1558 16     Temp 10/06/22 1558 98.7 F (37.1 C)     Temp Source 10/06/22 1558 Oral     SpO2 10/06/22 1558 95 %     Weight --      Height --      Head Circumference --      Peak Flow --      Pain Score 10/06/22 1559 4     Pain  Loc --      Pain Edu? --      Excl. in GC? --    No data found.  Updated Vital Signs BP (!) 143/89   Pulse 66   Temp 98.7 F (37.1 C) (Oral)   Resp 16   SpO2 95%   Visual Acuity Right Eye Distance:   Left Eye Distance:   Bilateral Distance:    Right Eye Near:   Left Eye Near:    Bilateral Near:     Physical Exam Vitals reviewed.  Constitutional:      General: He is awake.     Appearance: Normal appearance. He is well-developed. He is not ill-appearing.     Comments: Very pleasant male appears stated age in no acute distress sitting comfortably in exam room  HENT:     Head: Normocephalic and atraumatic.     Mouth/Throat:     Pharynx: No oropharyngeal exudate, posterior oropharyngeal erythema or uvula swelling.  Cardiovascular:     Rate and Rhythm: Normal rate and regular rhythm.     Heart sounds: Normal heart sounds, S1 normal and S2 normal. No murmur heard. Pulmonary:      Effort: Pulmonary effort is normal.     Breath sounds: Normal breath sounds. No stridor. No wheezing, rhonchi or rales.     Comments: Clear to auscultation bilaterally Musculoskeletal:     Right shoulder: Tenderness and bony tenderness present. No swelling. Decreased range of motion. Normal strength.     Cervical back: Spasms and tenderness present. No bony tenderness.     Thoracic back: No tenderness or bony tenderness.     Lumbar back: No tenderness or bony tenderness.     Comments: Right shoulder: Tenderness over AC joint.  Decreased range of motion with overhead flexion and circumduction secondary to pain.  Hand neurovascularly intact.  Neck/back: Tender to palpation over right trapezius with spasm.  No pain with percussion of vertebrae.  No deformity or step-off noted.  Neurological:     Mental Status: He is alert.  Psychiatric:        Behavior: Behavior is cooperative.      UC Treatments / Results  Labs (all labs ordered are listed, but only abnormal results are displayed) Labs Reviewed - No data to display  EKG   Radiology DG Shoulder Right  Result Date: 10/06/2022 CLINICAL DATA:  Chronic pain EXAM: RIGHT SHOULDER - 2+ VIEW COMPARISON:  Right shoulder x-ray 06/14/2018 FINDINGS: There is no evidence of fracture or dislocation. There are mild degenerative changes of the acromioclavicular joint. Soft tissues are unremarkable. IMPRESSION: Mild degenerative changes of the acromioclavicular joint. Electronically Signed   By: Darliss Cheney M.D.   On: 10/06/2022 16:22    Procedures Procedures (including critical care time)  Medications Ordered in UC Medications - No data to display  Initial Impression / Assessment and Plan / UC Course  I have reviewed the triage vital signs and the nursing notes.  Pertinent labs & imaging results that were available during my care of the patient were reviewed by me and considered in my medical decision making (see chart for details).      Patient is well-appearing, afebrile, nontoxic, nontachycardic.  X-ray was obtained given persistent symptoms that showed degenerative changes at [facility] joint but otherwise no acute osseous abnormality.  Discussed this could be contributing to his symptoms.  I suspect that he has spasms in his trapezius causing neck pain.  He was started on Mobic with instruction to take  additional NSAIDs.  Can use acetaminophen/Tylenol for breakthrough pain.  He was also given methocarbamol.  Discussed this can be sedating and he should not drive or drink alcohol with taking it.  I did recommend that he follow-up with orthopedics for further evaluation and management as he may benefit from additional imaging or referral to physical therapy.  He was given contact information for local provider with instruction to call to schedule an appointment.  Discussed that if he has any worsening or changing symptoms he needs to be seen immediately.  Strict return precautions given.  Final Clinical Impressions(s) / UC Diagnoses   Final diagnoses:  Arthritis of right acromioclavicular joint  Spasm of right trapezius muscle     Discharge Instructions      Your x-ray showed some arthritis where your collarbone and shoulder come together.  This could be contributing to your pain.  Start meloxicam daily.  Do not take NSAIDs with this medication including aspirin, ibuprofen/Advil, naproxen/Aleve.  Take methocarbamol twice daily.  This can make you sleepy so do not drive or drink alcohol with taking it.  I do recommend that you follow-up with sports medicine/orthopedics.  Call them to schedule an appointment.  If anything worsens you have increasing pain, decreased range of motion, swelling, numbness or tingling you should be seen immediately.     ED Prescriptions     Medication Sig Dispense Auth. Provider   meloxicam (MOBIC) 15 MG tablet Take 1 tablet (15 mg total) by mouth daily. 14 tablet Anissia Wessells K, PA-C   methocarbamol  (ROBAXIN) 500 MG tablet Take 1 tablet (500 mg total) by mouth 2 (two) times daily. 20 tablet Gennesis Hogland, Noberto Retort, PA-C      PDMP not reviewed this encounter.   Jeani Hawking, PA-C 10/06/22 1610

## 2022-10-06 NOTE — ED Triage Notes (Signed)
Pt reports having right shoulder dislocation in 1980s -- states has continued to bother him, especially over the past year. States pain is now radiating up into right lateral neck and down into right elbow. Has tried IBU at times. Denies any recent injuries.

## 2022-10-06 NOTE — Discharge Instructions (Signed)
Your x-ray showed some arthritis where your collarbone and shoulder come together.  This could be contributing to your pain.  Start meloxicam daily.  Do not take NSAIDs with this medication including aspirin, ibuprofen/Advil, naproxen/Aleve.  Take methocarbamol twice daily.  This can make you sleepy so do not drive or drink alcohol with taking it.  I do recommend that you follow-up with sports medicine/orthopedics.  Call them to schedule an appointment.  If anything worsens you have increasing pain, decreased range of motion, swelling, numbness or tingling you should be seen immediately.

## 2023-09-20 ENCOUNTER — Ambulatory Visit (HOSPITAL_COMMUNITY)
Admission: EM | Admit: 2023-09-20 | Discharge: 2023-09-20 | Disposition: A | Discharge status: Home / Self Care | Attending: Physician Assistant | Admitting: Physician Assistant

## 2023-09-20 ENCOUNTER — Encounter (HOSPITAL_COMMUNITY): Payer: Self-pay | Admitting: *Deleted

## 2023-09-20 DIAGNOSIS — M5442 Lumbago with sciatica, left side: Secondary | ICD-10-CM | POA: Diagnosis not present

## 2023-09-20 MED ORDER — PREDNISONE 10 MG PO TABS: 40.0000 mg | ORAL_TABLET | Freq: Every day | ORAL | 0 refills | Status: AC

## 2023-09-20 MED ORDER — METHOCARBAMOL 500 MG PO TABS: 500.0000 mg | ORAL_TABLET | Freq: Two times a day (BID) | ORAL | 0 refills | Status: DC

## 2023-09-20 NOTE — Discharge Instructions (Signed)
 Take muscle relaxer as needed for muscle spasm. Can take prednisone as prescribed. While they prednisone avoid other NSAIDs like ibuprofen or Aleve. May take Tylenol .   Recommend ice, gentle stretching.

## 2023-09-20 NOTE — ED Triage Notes (Signed)
 Pt states his back went out this morning when putting on his socks. He hasn't taken any meds

## 2023-09-20 NOTE — ED Provider Notes (Signed)
 [facility]    CSN: [id number] Arrival date & time: 09/20/23  1640      History   Chief Complaint Chief Complaint  Patient presents with   Back Pain    HPI Noah Smith is a 58 y.o. male.   Patient complains of lower back pain with radiation down his left leg that started today.  Patient reports he reached down to put socks on and felt a pull in his low back.  Patient has a history of lower back pain with intermittent flareups similar to today's symptoms.  He has not taken any medications today.  He reports sitting makes the pain worse.  Denies loss of bowel or bladder control.    Past Medical History:  Diagnosis Date   Shoulder dislocation     There are no active problems to display for this patient.   Past Surgical History:  Procedure Laterality Date   CERVICAL DISC SURGERY     HERNIA REPAIR     I & D EXTREMITY Left 03/28/2019   Procedure: REVISION AMPUTATION LEFT LONG FINGER;  Surgeon: Arvil Birks, MD;  Location: [facility];  Service: Orthopedics;  Laterality: Left;   NECK SURGERY         Home Medications    Prior to Admission medications   Medication Sig Start Date End Date Taking? Authorizing Provider  methocarbamol  (ROBAXIN ) 500 MG tablet Take 1 tablet (500 mg total) by mouth 2 (two) times daily. 09/20/23  Yes Ward, Char Common, PA-C  predniSONE (DELTASONE) 10 MG tablet Take 4 tablets (40 mg total) by mouth daily for 5 days. 09/20/23 09/25/23 Yes Ward, Char Common, PA-C  meloxicam  (MOBIC ) 15 MG tablet Take 1 tablet (15 mg total) by mouth daily. 10/06/22   Raspet, Betsey Brow, PA-C    Family History Family History  Problem Relation Age of Onset   Cancer Father     Social History Social History   Tobacco Use   Smoking status: Every Day    Current packs/day: 0.50    Types: Cigarettes   Smokeless tobacco: Never  Vaping Use   Vaping status: Never Used  Substance Use Topics   Alcohol use: Yes    Comment: rarely   Drug use: Never     Allergies    Patient has no known allergies.   Review of Systems Review of Systems  Constitutional:  Negative for chills and fever.  HENT:  Negative for ear pain and sore throat.   Eyes:  Negative for pain and visual disturbance.  Respiratory:  Negative for cough and shortness of breath.   Cardiovascular:  Negative for chest pain and palpitations.  Gastrointestinal:  Negative for abdominal pain and vomiting.  Genitourinary:  Negative for dysuria and hematuria.  Musculoskeletal:  Positive for back pain. Negative for arthralgias.  Skin:  Negative for color change and rash.  Neurological:  Negative for seizures and syncope.  All other systems reviewed and are negative.    Physical Exam Triage Vital Signs ED Triage Vitals [09/20/23 1700]  Encounter Vitals Group     BP 137/86     Systolic BP Percentile      Diastolic BP Percentile      Pulse      Resp      Temp 98.5 F (36.9 C)     Temp Source Oral     SpO2 95 %     Weight      Height      Head Circumference  Peak Flow      Pain Score 8     Pain Loc      Pain Education      Exclude from Growth Chart    No data found.  Updated Vital Signs BP 137/86 (BP Location: Left Arm)   Temp 98.5 F (36.9 C) (Oral)   SpO2 95%   Visual Acuity Right Eye Distance:   Left Eye Distance:   Bilateral Distance:    Right Eye Near:   Left Eye Near:    Bilateral Near:     Physical Exam Vitals and nursing note reviewed.  Constitutional:      General: He is not in acute distress.    Appearance: He is well-developed.  HENT:     Head: Normocephalic and atraumatic.  Eyes:     Conjunctiva/sclera: Conjunctivae normal.  Cardiovascular:     Rate and Rhythm: Normal rate and regular rhythm.     Heart sounds: No murmur heard. Pulmonary:     Effort: Pulmonary effort is normal. No respiratory distress.     Breath sounds: Normal breath sounds.  Abdominal:     Palpations: Abdomen is soft.     Tenderness: There is no abdominal tenderness.   Musculoskeletal:        General: No swelling.     Cervical back: Neck supple.     Comments: Positive left straight leg raise  Skin:    General: Skin is warm and dry.     Capillary Refill: Capillary refill takes less than 2 seconds.  Neurological:     Mental Status: He is alert.  Psychiatric:        Mood and Affect: Mood normal.      UC Treatments / Results  Labs (all labs ordered are listed, but only abnormal results are displayed) Labs Reviewed - No data to display  EKG   Radiology No results found.  Procedures Procedures (including critical care time)  Medications Ordered in UC Medications - No data to display  Initial Impression / Assessment and Plan / UC Course  I have reviewed the triage vital signs and the nursing notes.  Pertinent labs & imaging results that were available during my care of the patient were reviewed by me and considered in my medical decision making (see chart for details).     Acute on chronic lower back pain.  Will treat exacerbation with muscle relaxers, prednisone course, and supportive care.  No red flag symptoms today.  Return precautions discussed. Final Clinical Impressions(s) / UC Diagnoses   Final diagnoses:  Acute bilateral low back pain with left-sided sciatica   Discharge Instructions      Take muscle relaxer as needed for muscle spasm. Can take prednisone as prescribed. While they prednisone avoid other NSAIDs like ibuprofen or Aleve. May take Tylenol .   Recommend ice, gentle stretching.   ED Prescriptions     Medication Sig Dispense Auth. Provider   methocarbamol  (ROBAXIN ) 500 MG tablet Take 1 tablet (500 mg total) by mouth 2 (two) times daily. 20 tablet Ward, Kasey Ewings Z, PA-C   predniSONE (DELTASONE) 10 MG tablet Take 4 tablets (40 mg total) by mouth daily for 5 days. 20 tablet Ward, Kou Gucciardo Z, PA-C      PDMP not reviewed this encounter.   Ward, Char Common, PA-C 09/20/23 [phone]

## 2023-09-27 ENCOUNTER — Ambulatory Visit (INDEPENDENT_AMBULATORY_CARE_PROVIDER_SITE_OTHER)

## 2023-09-27 ENCOUNTER — Ambulatory Visit (HOSPITAL_COMMUNITY)
Admission: EM | Admit: 2023-09-27 | Discharge: 2023-09-27 | Disposition: A | Discharge status: Home / Self Care | Attending: Emergency Medicine | Admitting: Emergency Medicine

## 2023-09-27 ENCOUNTER — Encounter (HOSPITAL_COMMUNITY): Payer: Self-pay

## 2023-09-27 DIAGNOSIS — G8929 Other chronic pain: Secondary | ICD-10-CM | POA: Diagnosis not present

## 2023-09-27 DIAGNOSIS — M5442 Lumbago with sciatica, left side: Secondary | ICD-10-CM

## 2023-09-27 HISTORY — DX: Low back pain, unspecified: M54.50

## 2023-09-27 MED ORDER — CYCLOBENZAPRINE HCL 10 MG PO TABS: 10.0000 mg | ORAL_TABLET | Freq: Two times a day (BID) | ORAL | 0 refills | Status: DC | PRN

## 2023-09-27 MED ORDER — KETOROLAC TROMETHAMINE 10 MG PO TABS: 10.0000 mg | ORAL_TABLET | Freq: Four times a day (QID) | ORAL | 0 refills | Status: AC | PRN

## 2023-09-27 MED ORDER — KETOROLAC TROMETHAMINE 30 MG/ML IJ SOLN
30.0000 mg | Freq: Once | INTRAMUSCULAR | Status: AC
  Administered 2023-09-27: 30 mg via INTRAMUSCULAR

## 2023-09-27 MED ORDER — KETOROLAC TROMETHAMINE 30 MG/ML IJ SOLN
INTRAMUSCULAR | Status: AC
  Filled 2023-09-27: qty 1

## 2023-09-27 NOTE — ED Provider Notes (Signed)
 [facility]    CSN: [id number] Arrival date & time: 09/27/23  0930      History   Chief Complaint Chief Complaint  Patient presents with   Back Pain    HPI Noah Smith is a 58 y.o. male.  Here with 1 week history of bilateral low back pain, radiates down the left leg. Currently rating 10/10 pain. He was seen last week for same, was given prednisone  burst.  Reports this did not help at all, caused some heartburn. He also used Robaxin  without relief  No new injury, trauma, fall. Not having any bladder or bowel dysfunction No weakness or paresthesias  Patient with history of of degenerative spine, chronic low back pain.  Has followed with [facility] in the past Has not seen them in several years  Past Medical History:  Diagnosis Date   Low back pain    Shoulder dislocation     There are no active problems to display for this patient.   Past Surgical History:  Procedure Laterality Date   CERVICAL DISC SURGERY     HERNIA REPAIR     I & D EXTREMITY Left 03/28/2019   Procedure: REVISION AMPUTATION LEFT LONG FINGER;  Surgeon: Arvil Birks, MD;  Location: [facility];  Service: Orthopedics;  Laterality: Left;   NECK SURGERY         Home Medications    Prior to Admission medications   Medication Sig Start Date End Date Taking? Authorizing Provider  cyclobenzaprine  (FLEXERIL ) 10 MG tablet Take 1 tablet (10 mg total) by mouth 2 (two) times daily as needed for muscle spasms. 09/27/23  Yes Kentavius Dettore, Ivette Marks, PA-C  ketorolac (TORADOL) 10 MG tablet Take 1 tablet (10 mg total) by mouth every 6 (six) hours as needed for up to 5 days. 09/27/23 10/02/23 Yes Sharifa Bucholz, Ivette Marks, PA-C  methocarbamol  (ROBAXIN ) 500 MG tablet Take 1 tablet (500 mg total) by mouth 2 (two) times daily. 09/20/23   Ward, Char Common, PA-C    Family History Family History  Problem Relation Age of Onset   Cancer Father     Social History Social History   Tobacco Use   Smoking status: Every Day     Current packs/day: 0.50    Types: Cigarettes   Smokeless tobacco: Never  Vaping Use   Vaping status: Never Used  Substance Use Topics   Alcohol use: Yes    Comment: rarely   Drug use: Never     Allergies   Patient has no known allergies.   Review of Systems Review of Systems  Musculoskeletal:  Positive for back pain.   HPI  Physical Exam Triage Vital Signs ED Triage Vitals [09/27/23 1028]  Encounter Vitals Group     BP 134/76     Systolic BP Percentile      Diastolic BP Percentile      Pulse Rate (!) 56     Resp 16     Temp 97.9 F (36.6 C)     Temp Source Oral     SpO2 95 %     Weight      Height      Head Circumference      Peak Flow      Pain Score 10     Pain Loc      Pain Education      Exclude from Growth Chart    No data found.  Updated Vital Signs BP 134/76 (BP Location: Left Arm)   Pulse (!) 56  Temp 97.9 F (36.6 C) (Oral)   Resp 16   SpO2 95%   Visual Acuity Right Eye Distance:   Left Eye Distance:   Bilateral Distance:    Right Eye Near:   Left Eye Near:    Bilateral Near:     Physical Exam Vitals and nursing note reviewed.  Constitutional:      General: He is not in acute distress. HENT:     Mouth/Throat:     Mouth: Mucous membranes are moist.     Pharynx: Oropharynx is clear.  Eyes:     Extraocular Movements: Extraocular movements intact.     Conjunctiva/sclera: Conjunctivae normal.     Pupils: Pupils are equal, round, and reactive to light.  Cardiovascular:     Rate and Rhythm: Normal rate and regular rhythm.     Pulses: Normal pulses.     Heart sounds: Normal heart sounds.  Pulmonary:     Effort: Pulmonary effort is normal.     Breath sounds: Normal breath sounds.  Musculoskeletal:     Cervical back: Normal range of motion. No rigidity or tenderness.     Lumbar back: Tenderness and bony tenderness present. Positive left straight leg raise test.       Back:     Comments: Tender over low back including lumbar spine.   Skin:    General: Skin is warm and dry.     Findings: No bruising, erythema or lesion.  Neurological:     General: No focal deficit present.     Mental Status: He is alert and oriented to person, place, and time.     Cranial Nerves: Cranial nerves 2-12 are intact. No cranial nerve deficit.     Sensory: Sensation is intact.     Motor: Motor function is intact. No weakness.     Coordination: Coordination is intact.     Gait: Gait is intact.     Deep Tendon Reflexes: Reflexes are normal and symmetric.     Comments: Strength 5/5. Sensation intact throughout      UC Treatments / Results  Labs (all labs ordered are listed, but only abnormal results are displayed) Labs Reviewed - No data to display  EKG   Radiology DG Lumbar Spine Complete Result Date: 09/27/2023 CLINICAL DATA:  Back pain. EXAM: LUMBAR SPINE - COMPLETE 4+ VIEW COMPARISON:  Radiograph dated 03/04/2009. FINDINGS: Five lumbar type vertebra. No acute fracture subluxation of the lumbar spine. Mild lower lumbar facet arthropathy. The soft tissues are unremarkable. IMPRESSION: 1. No acute findings. 2. Mild lower lumbar facet arthropathy. Electronically Signed   By: Angus Bark M.D.   On: 09/27/2023 11:16    Procedures Procedures   Medications Ordered in UC Medications  ketorolac (TORADOL) 30 MG/ML injection 30 mg (30 mg Intramuscular Given 09/27/23 1102)    Initial Impression / Assessment and Plan / UC Course  I have reviewed the triage vital signs and the nursing notes.  Pertinent labs & imaging results that were available during my care of the patient were reviewed by me and considered in my medical decision making (see chart for details).  Afebrile, well-appearing, stable vitals Neurologically intact.  No red flags. Chronic low back pain with acute flare, left side sciatica  IM Toradol given in clinic Lumbar x-ray without acute finding. Mild lumbar facet arthropathy. Images independently reviewed by me,  agree with radiology interpretation. Oral toradol q6 hours prn x 5 days. Discussed not using other NSAIDs while taking. Flexeril  BID prn with drowsy  precautions  Discussed importance of following up with orthopedics Return and ED precautions   Final Clinical Impressions(s) / UC Diagnoses   Final diagnoses:  Acute bilateral low back pain with left-sided sciatica  Chronic bilateral low back pain, unspecified whether sciatica present     Discharge Instructions      Your xray does have some mild degenerative changes, but no other new findings.   The toradol injection will hopefully provide several hours of pain relief. If needed please take the oral toradol tablets -- 1 tab every 6 hours as needed. You can take for 5 days in a row, or shorter if pain resolves. Please DO NOT use any other NSAIDs such as ibuprofen, advil, motrin, naproxen, aleve, meloxicam , etc You CAN safely use tylenol    You can take the muscle relaxer Flexeril  twice daily. If the medication makes you drowsy, take only at bed time.  Please follow up with your orthopedic specialist Towana Freshwater) You can go to the urgent care walk in hours from 8am-8pm Monday through Friday   ED Prescriptions     Medication Sig Dispense Auth. Provider   ketorolac (TORADOL) 10 MG tablet Take 1 tablet (10 mg total) by mouth every 6 (six) hours as needed for up to 5 days. 20 tablet Cloy Cozzens, PA-C   cyclobenzaprine  (FLEXERIL ) 10 MG tablet Take 1 tablet (10 mg total) by mouth 2 (two) times daily as needed for muscle spasms. 20 tablet Yedidya Duddy, Ivette Marks, PA-C      PDMP not reviewed this encounter.   Socorro Ebron, Ivette Marks, [place] 09/27/23 1127

## 2023-09-27 NOTE — ED Triage Notes (Signed)
 Patient here today with c/o LB pain X 1 week. Patient states that the pain has been worsening since coming her last week. Pain radiates down left leg. The medication that was prescribed last time did not help.

## 2023-09-27 NOTE — Discharge Instructions (Addendum)
 Your xray does have some mild degenerative changes, but no other new findings.   The toradol injection will hopefully provide several hours of pain relief. If needed please take the oral toradol tablets -- 1 tab every 6 hours as needed. You can take for 5 days in a row, or shorter if pain resolves. Please DO NOT use any other NSAIDs such as ibuprofen, advil, motrin, naproxen, aleve, meloxicam , etc You CAN safely use tylenol    You can take the muscle relaxer Flexeril  twice daily. If the medication makes you drowsy, take only at bed time.  Please follow up with your orthopedic specialist Towana Freshwater) You can go to the urgent care walk in hours from 8am-8pm Monday through Friday

## 2023-10-04 ENCOUNTER — Ambulatory Visit (HOSPITAL_COMMUNITY)
Admission: EM | Admit: 2023-10-04 | Discharge: 2023-10-04 | Disposition: A | Discharge status: Home / Self Care | Attending: Emergency Medicine | Admitting: Emergency Medicine

## 2023-10-04 ENCOUNTER — Encounter (HOSPITAL_COMMUNITY): Payer: Self-pay

## 2023-10-04 DIAGNOSIS — M5442 Lumbago with sciatica, left side: Secondary | ICD-10-CM

## 2023-10-04 DIAGNOSIS — G8929 Other chronic pain: Secondary | ICD-10-CM | POA: Diagnosis not present

## 2023-10-04 DIAGNOSIS — M62830 Muscle spasm of back: Secondary | ICD-10-CM

## 2023-10-04 MED ORDER — MELOXICAM 7.5 MG PO TABS: 7.5000 mg | ORAL_TABLET | Freq: Every day | ORAL | 0 refills | Status: DC

## 2023-10-04 MED ORDER — KETOROLAC TROMETHAMINE 30 MG/ML IJ SOLN
INTRAMUSCULAR | Status: AC
  Filled 2023-10-04: qty 1

## 2023-10-04 MED ORDER — KETOROLAC TROMETHAMINE 30 MG/ML IJ SOLN
30.0000 mg | Freq: Once | INTRAMUSCULAR | Status: AC
Start: 2023-10-04 — End: 2023-10-04
  Administered 2023-10-04: 30 mg via INTRAMUSCULAR

## 2023-10-04 NOTE — ED Triage Notes (Signed)
 Patient here today with c/o LB pain X 2 weeks. Patient was here last week and states that the pain is worsening. Patient has been taking the muscle relaxers with no relief.

## 2023-10-04 NOTE — Discharge Instructions (Signed)
 You received an injection of Toradol  in clinic today.  Do not take any NSAIDs for at least 8 hours after receiving this medication.  NSAIDs include ibuprofen, ketorolac , meloxicam , naproxen, Advil, Aleve, Motrin, and Goody powder. I prescribed meloxicam  that you can take once daily to help with your pain.  Do not take this in combination with additional NSAIDs as listed above. Otherwise you can continue to take the Flexeril  2 times daily as needed for muscle pain and spasms.  As a reminder this can make you drowsy so do not drive, work, or drink alcohol while taking this. Otherwise recommend taking 500 to 1000 mg of Tylenol  every 8 hours as needed for breakthrough pain.  Do not exceed 4000 mg of Tylenol  in a day. Alternate between ice and heat as needed for pain.  And do some gentle stretching to avoid becoming stiff.  Please follow-up with [facility] for further evaluation and management of your continued back pain. Return here as needed.

## 2023-10-04 NOTE — ED Provider Notes (Signed)
 [facility]    CSN: [id number] Arrival date & time: 10/04/23  1102      History   Chief Complaint Chief Complaint  Patient presents with   Back Pain    HPI Noah Smith is a 58 y.o. male.   Patient presents with continued bilateral low back pain x 2 weeks.  Patient has been seen here twice in the last 2 weeks for same.  Patient states that his pain seems to becoming worse.  Patient states that he initially took the Robaxin  and prednisone  as prescribed without relief and then returned here.    Patient was then prescribed ketorolac  and Flexeril  during his second visit.  Patient states that he has been taking the Flexeril  without any relief.  Patient states that he did not take the ketorolac  because he googled the side effects and was concerned about having a stroke or a heart attack.    Patient denies taking any other medication other than Flexeril  for his pain.  Patient denies seeing a specialist in the last 2 weeks as previously advised for his back pain.  Patient also endorses feeling of muscle spasm in his right mid back that was not there previously.  Patient denies abdominal pain, urinary symptoms, fever, body aches, and chills.  The history is provided by the patient and medical records.  Back Pain   Past Medical History:  Diagnosis Date   Low back pain    Shoulder dislocation     There are no active problems to display for this patient.   Past Surgical History:  Procedure Laterality Date   CERVICAL DISC SURGERY     HERNIA REPAIR     I & D EXTREMITY Left 03/28/2019   Procedure: REVISION AMPUTATION LEFT LONG FINGER;  Surgeon: Arvil Birks, MD;  Location: [facility] OR;  Service: Orthopedics;  Laterality: Left;   NECK SURGERY         Home Medications    Prior to Admission medications   Medication Sig Start Date End Date Taking? Authorizing Provider  meloxicam  (MOBIC ) 7.5 MG tablet Take 1 tablet (7.5 mg total) by mouth daily. 10/04/23  Yes Rosevelt Constable,  Artez Regis A, NP  cyclobenzaprine  (FLEXERIL ) 10 MG tablet Take 1 tablet (10 mg total) by mouth 2 (two) times daily as needed for muscle spasms. 09/27/23   Rising, Ivette Marks, PA-C  methocarbamol  (ROBAXIN ) 500 MG tablet Take 1 tablet (500 mg total) by mouth 2 (two) times daily. 09/20/23   Ward, Char Common, PA-C    Family History Family History  Problem Relation Age of Onset   Cancer Father     Social History Social History   Tobacco Use   Smoking status: Every Day    Current packs/day: 0.50    Types: Cigarettes   Smokeless tobacco: Never  Vaping Use   Vaping status: Never Used  Substance Use Topics   Alcohol use: Yes    Comment: rarely   Drug use: Never     Allergies   Patient has no known allergies.   Review of Systems Review of Systems  Musculoskeletal:  Positive for back pain.   Per HPI  Physical Exam Triage Vital Signs ED Triage Vitals  Encounter Vitals Group     BP 10/04/23 1116 116/84     Girls Systolic BP Percentile --      Girls Diastolic BP Percentile --      Boys Systolic BP Percentile --      Boys Diastolic BP Percentile --  Pulse Rate 10/04/23 1116 98     Resp 10/04/23 1116 16     Temp 10/04/23 1116 98 F (36.7 C)     Temp Source 10/04/23 1116 Oral     SpO2 10/04/23 1116 95 %     Weight --      Height --      Head Circumference --      Peak Flow --      Pain Score 10/04/23 1117 10     Pain Loc --      Pain Education --      Exclude from Growth Chart --    No data found.  Updated Vital Signs BP 116/84 (BP Location: Right Arm)   Pulse 98   Temp 98 F (36.7 C) (Oral)   Resp 16   SpO2 95%   Visual Acuity Right Eye Distance:   Left Eye Distance:   Bilateral Distance:    Right Eye Near:   Left Eye Near:    Bilateral Near:     Physical Exam Vitals and nursing note reviewed.  Constitutional:      General: He is awake. He is not in acute distress.    Appearance: Normal appearance. He is well-developed and well-groomed. He is not  ill-appearing.   Musculoskeletal:     Cervical back: Normal, normal range of motion and neck supple.     Thoracic back: Spasms and tenderness present. No swelling, edema, deformity, signs of trauma or bony tenderness.     Lumbar back: Tenderness and bony tenderness present. Positive left straight leg raise test.       Back:   Skin:    General: Skin is warm and dry.   Neurological:     Mental Status: He is alert.   Psychiatric:        Behavior: Behavior is cooperative.      UC Treatments / Results  Labs (all labs ordered are listed, but only abnormal results are displayed) Labs Reviewed - No data to display  EKG   Radiology No results found.  Procedures Procedures (including critical care time)  Medications Ordered in UC Medications  ketorolac  (TORADOL ) 30 MG/ML injection 30 mg (30 mg Intramuscular Given 10/04/23 1201)    Initial Impression / Assessment and Plan / UC Course  I have reviewed the triage vital signs and the nursing notes.  Pertinent labs & imaging results that were available during my care of the patient were reviewed by me and considered in my medical decision making (see chart for details).     Patient is not ill-appearing.  But upon entering the room patient is in a wheelchair and appears to be in pain.  Upon assessment tenderness noted to bilateral low back with tenderness noted to lumbar spine as well.  Positive left straight leg raise test.  Tenderness noted to right mid back with spasm present as well.  Given IM Toradol  in clinic for acute pain.  Prescribed meloxicam  for pain.  Recommended continuing with Flexeril  as needed.  Also recommended taking Tylenol  as needed for breakthrough pain.  Given information for orthopedic follow-up and stressed the importance of following up with them for further evaluation and management.  Discussed follow-up and return precautions. Final Clinical Impressions(s) / UC Diagnoses   Final diagnoses:  Chronic  bilateral low back pain with left-sided sciatica  Muscle spasm of back     Discharge Instructions      You received an injection of Toradol  in clinic today.  Do  not take any NSAIDs for at least 8 hours after receiving this medication.  NSAIDs include ibuprofen, ketorolac , meloxicam , naproxen, Advil, Aleve, Motrin, and Goody powder. I prescribed meloxicam  that you can take once daily to help with your pain.  Do not take this in combination with additional NSAIDs as listed above. Otherwise you can continue to take the Flexeril  2 times daily as needed for muscle pain and spasms.  As a reminder this can make you drowsy so do not drive, work, or drink alcohol while taking this. Otherwise recommend taking 500 to 1000 mg of Tylenol  every 8 hours as needed for breakthrough pain.  Do not exceed 4000 mg of Tylenol  in a day. Alternate between ice and heat as needed for pain.  And do some gentle stretching to avoid becoming stiff.  Please follow-up with [facility] for further evaluation and management of your continued back pain. Return here as needed.     ED Prescriptions     Medication Sig Dispense Auth. Provider   meloxicam  (MOBIC ) 7.5 MG tablet Take 1 tablet (7.5 mg total) by mouth daily. 30 tablet Levora Reas A, NP      I have reviewed the PDMP during this encounter.   Levora Reas A, NP 10/04/23 1327

## 2023-10-09 ENCOUNTER — Emergency Department (HOSPITAL_COMMUNITY)
Admission: EM | Admit: 2023-10-09 | Discharge: 2023-10-09 | Disposition: A | Discharge status: Home / Self Care | Attending: Emergency Medicine | Admitting: Emergency Medicine

## 2023-10-09 ENCOUNTER — Other Ambulatory Visit: Payer: Self-pay

## 2023-10-09 DIAGNOSIS — M5432 Sciatica, left side: Secondary | ICD-10-CM | POA: Diagnosis not present

## 2023-10-09 DIAGNOSIS — M549 Dorsalgia, unspecified: Secondary | ICD-10-CM | POA: Diagnosis present

## 2023-10-09 MED ORDER — CYCLOBENZAPRINE HCL 10 MG PO TABS
10.0000 mg | ORAL_TABLET | Freq: Two times a day (BID) | ORAL | 0 refills | Status: DC | PRN
Start: 2023-10-09 — End: 2023-11-24

## 2023-10-09 MED ORDER — PREDNISONE 20 MG PO TABS: ORAL_TABLET | ORAL | 0 refills | Status: DC

## 2023-10-09 MED ORDER — KETOROLAC TROMETHAMINE 15 MG/ML IJ SOLN
15.0000 mg | Freq: Once | INTRAMUSCULAR | Status: AC
Start: 2023-10-09 — End: 2023-10-09
  Administered 2023-10-09: 15 mg via INTRAMUSCULAR
  Filled 2023-10-09: qty 1

## 2023-10-09 NOTE — Discharge Instructions (Addendum)
 Your symptoms likely due to a pinched nerve in your lower back causing pain shooting down the legs.  This is sciatica.  Follow rehab instruction as listed.  You may try taking prednisone  as prescribed but if you develop signs of allergic reaction then you may discontinue the medication.  Continue to use Flexeril  and your meloxicam .  Follow-up with an orthopedic specialist for further care.

## 2023-10-09 NOTE — ED Triage Notes (Signed)
 PT complains of lower back pain x 1 week. Has been seen at urgent care for same and given Meloxicam . Pt states pain is radiating down front of left leg. Pain has not improved with medication. Pt states pain is worse and difficulty walking

## 2023-10-09 NOTE — ED Provider Notes (Signed)
 [place] [facility] AT [facility] Provider Note   CSN: [id number] Arrival date & time: 10/09/23  1047     Patient presents with: Back Pain   Noah Smith is a 58 y.o. male.   The history is provided by the patient and medical records. No language interpreter was used.  Back Pain      58 year old male with history of low back pain presenting with complaint of back pain.  Patient states for the past 3 weeks he has had recurrent pain to his back.  Pain is primarily to his left lower back that has been waxing waning but for the past 2 days pain has radiates down to his left leg.  He describes a burning sharp stabbing sensation radiates towards his left knee worse with any other movement.  He does not endorse any fever or chills no abdominal pain no dysuria hematuria no bowel bladder incontinence or saddle seizure.  Denies any history of IV drug use or active cancer.  He has been seen evaluate at urgent care center multiple times for his symptoms within the past 3 weeks and he was prescribed prednisone , Flexeril , meloxicam  somewhat sometimes helps.  He mention prednisone  gives him heartburn.  He has not follow-up with the orthopedic specialist yet.  Prior to Admission medications   Medication Sig Start Date End Date Taking? Authorizing Provider  cyclobenzaprine  (FLEXERIL ) 10 MG tablet Take 1 tablet (10 mg total) by mouth 2 (two) times daily as needed for muscle spasms. 09/27/23   Rising, [place], PA-C  meloxicam  (MOBIC ) 7.5 MG tablet Take 1 tablet (7.5 mg total) by mouth daily. 10/04/23   Johnie Flaming A, NP  methocarbamol  (ROBAXIN ) 500 MG tablet Take 1 tablet (500 mg total) by mouth 2 (two) times daily. 09/20/23   Ward, Harlene PEDLAR, PA-C    Allergies: Prednisone     Review of Systems  Musculoskeletal:  Positive for back pain.  All other systems reviewed and are negative.   Updated Vital Signs BP (!) 140/110 (BP Location: Right Arm)   Pulse 98   Temp 98.8 F  (37.1 C)   Resp 16   Wt 77 kg   SpO2 95%   BMI 23.02 kg/m   Physical Exam Constitutional:      General: He is not in acute distress.    Appearance: He is well-developed.  HENT:     Head: Atraumatic.   Eyes:     Conjunctiva/sclera: Conjunctivae normal.   Abdominal:     Palpations: Abdomen is soft.     Tenderness: There is no abdominal tenderness.   Musculoskeletal:        General: Tenderness (Tenderness to the left lumbar paraspinal muscle on palpation.  Some pain with left straight leg raise but not intense.  Patellar deep tendon reflex intact bilaterally no foot drops.) present.     Cervical back: Normal range of motion and neck supple.   Skin:    Capillary Refill: Capillary refill takes less than 2 seconds.     Findings: No rash.   Neurological:     Mental Status: He is alert.     (all labs ordered are listed, but only abnormal results are displayed) Labs Reviewed - No data to display  EKG: None  Radiology: No results found.   Procedures   Medications Ordered in the ED  ketorolac  (TORADOL ) 15 MG/ML injection 15 mg (15 mg Intramuscular Given 10/09/23 1230)  Medical Decision Making  BP (!) 140/110 (BP Location: Right Arm)   Pulse 98   Temp 98.8 F (37.1 C)   Resp 16   Wt 77 kg   SpO2 95%   BMI 23.02 kg/m     57 year old male with history of low back pain presenting with complaint of back pain.  Patient states for the past 3 weeks he has had recurrent pain to his back.  Pain is primarily to his left lower back that has been waxing waning but for the past 2 days pain has radiates down to his left leg.  He describes a burning sharp stabbing sensation radiates towards his left knee worse with any other movement.  He does not endorse any fever or chills no abdominal pain no dysuria hematuria no bowel bladder incontinence or saddle seizure.  Denies any history of IV drug use or active cancer.  He has been seen evaluate at  urgent care center multiple times for his symptoms within the past 3 weeks and he was prescribed prednisone , Flexeril , meloxicam  somewhat sometimes helps.  He mention prednisone  gives him heartburn.  He has not follow-up with the orthopedic specialist yet.  On exam patient is resting comfortably appears to be no in no acute discomfort.  He does have some tenderness to his lumbar paraspinal muscle more significant to the left side.  Straight leg raise elicits some pain however he is neurovascular intact and skin exam overall unremarkable.  Patient without any red flags, suspect this is likely sciatica causing his symptoms.  Advanced imaging including lumbar MRI considered but not performed as patient able to ambulate and does not have any red flags.  EMR reviewed patient has been seen evaluated several times at urgent care center for same.  I do not think patient necessary need further workup in the ER but encourage patient to follow-up with an orthopedic specialist for further care.  Toradol  given for pain and will discharge home with prednisone , Flexeril , and for him to continue with meloxicam .  12:47 PM Improvement of pain after treatment.  Pt able to ambulate and stable for discharge.       Final diagnoses:  Sciatica, left side    ED Discharge Orders          Ordered    cyclobenzaprine  (FLEXERIL ) 10 MG tablet  2 times daily PRN        10/09/23 1303    predniSONE  (DELTASONE ) 20 MG tablet        10/09/23 1303               Nivia Colon, PA-C 10/09/23 1304    Cottie Donnice PARAS, MD 10/09/23 1320

## 2023-10-09 NOTE — ED Notes (Signed)
 Pt. Ambulated w/o assistance to end of hallway w/ no complaints. Pt. Stated meds did help slightly. Pt. Returned to bed.

## 2023-11-24 ENCOUNTER — Encounter (HOSPITAL_COMMUNITY): Payer: Self-pay

## 2023-11-24 ENCOUNTER — Ambulatory Visit (HOSPITAL_COMMUNITY): Admission: EM | Admit: 2023-11-24 | Discharge: 2023-11-24 | Disposition: A | Discharge status: Home / Self Care

## 2023-11-24 ENCOUNTER — Ambulatory Visit (INDEPENDENT_AMBULATORY_CARE_PROVIDER_SITE_OTHER)

## 2023-11-24 DIAGNOSIS — M25562 Pain in left knee: Secondary | ICD-10-CM

## 2023-11-24 DIAGNOSIS — S86912A Strain of unspecified muscle(s) and tendon(s) at lower leg level, left leg, initial encounter: Secondary | ICD-10-CM | POA: Diagnosis not present

## 2023-11-24 MED ORDER — DICLOFENAC SODIUM 50 MG PO TBEC: 50.0000 mg | DELAYED_RELEASE_TABLET | Freq: Two times a day (BID) | ORAL | 1 refills | Status: AC

## 2023-11-24 NOTE — Discharge Instructions (Addendum)
  1. Strain of left knee, initial encounter (Primary) - DG Knee Complete 4 Views Left x-ray performed in UC shows no acute bony injury to the left knee. - Follow-up with [facility] as scheduled on 11/29/2023 for further evaluation and management. - Take prescribed diclofenac  50 mg twice daily as needed for pain and inflammation for the knee and lower back pain -Continue to monitor symptoms for any change in severity if there is any escalation of current symptoms or development of new symptoms follow-up in ER for further evaluation and management.

## 2023-11-24 NOTE — ED Triage Notes (Addendum)
 Patient presenting with left knee pain with a knot onset 11/11/23. States he had an injection in his back 11/11/23 that he thinks is related.  Prescriptions or OTC medications tried: Yes- tiger balm, Celebrex    with little relief

## 2023-11-24 NOTE — ED Provider Notes (Signed)
 [facility] [place]  Note:  This document was prepared using Dragon voice recognition software and may include unintentional dictation errors.  MRN: [id number] DOB: [birth year]  Subjective:   ADRIN Smith is a 58 y.o. male presenting for left knee pain x 2 weeks.  Patient reports that he has recently had issues with his lower back due to a bulging disc.  Patient reports that prior to the onset of his left knee pain he had a corticosteroid injection in his lumbar spine and then noted pain radiating to his left knee.  Patient has been using over-the-counter topical treatment as well as Celebrex with minimal relief.  Patient is using crutches to help take some of the pressure off his lower back.  Patient denies any recent trauma or injury to the left knee.  Patient states that he feels a knot in the muscle superior to his patella when he places his hands on this area and pushes he can feel pain around his knee.  No prior injury or trauma to the knee.  No current facility-administered medications for this encounter.  Current Outpatient Medications:    celecoxib (CELEBREX) 200 MG capsule, Take 200 mg by mouth 3 (three) times daily as needed., Disp: , Rfl:    diclofenac  (VOLTAREN ) 50 MG EC tablet, Take 1 tablet (50 mg total) by mouth 2 (two) times daily., Disp: 30 tablet, Rfl: 1   Allergies  Allergen Reactions   Prednisone      Heart burn and joint pain    Past Medical History:  Diagnosis Date   Low back pain    Shoulder dislocation      Past Surgical History:  Procedure Laterality Date   CERVICAL DISC SURGERY     HERNIA REPAIR     I & D EXTREMITY Left 03/28/2019   Procedure: REVISION AMPUTATION LEFT LONG FINGER;  Surgeon: Shari Easter, MD;  Location: [facility];  Service: Orthopedics;  Laterality: Left;   NECK SURGERY      Family History  Problem Relation Age of Onset   Cancer Father     Social History   Tobacco Use   Smoking status: Every Day    Current packs/day: 0.50     Types: Cigarettes   Smokeless tobacco: Never  Vaping Use   Vaping status: Never Used  Substance Use Topics   Alcohol use: Yes    Comment: rarely   Drug use: Never    ROS Refer to HPI for ROS details.  Objective:   Vitals: BP 131/83 (BP Location: Left Arm)   Pulse 67   Temp 97.7 F (36.5 C) (Oral)   Resp 18   Ht 6' (1.829 m)   Wt 175 lb (79.4 kg)   SpO2 98%   BMI 23.73 kg/m   Physical Exam Vitals and nursing note reviewed.  Constitutional:      General: He is not in acute distress.    Appearance: Normal appearance. He is well-developed. He is not ill-appearing or toxic-appearing.  HENT:     Head: Normocephalic.  Cardiovascular:     Rate and Rhythm: Normal rate.  Pulmonary:     Effort: Pulmonary effort is normal. No respiratory distress.  Musculoskeletal:     Left knee: Bony tenderness present. No swelling, effusion or erythema. Decreased range of motion. Tenderness present. Normal pulse.  Skin:    General: Skin is warm and dry.  Neurological:     General: No focal deficit present.     Mental Status: He is alert and  oriented to person, place, and time.  Psychiatric:        Mood and Affect: Mood normal.        Behavior: Behavior normal.     Procedures  No results found for this or any previous visit (from the past 24 hours).  DG Knee Complete 4 Views Left Result Date: 11/24/2023 CLINICAL DATA:  Left knee pain with palpable knot for approximately 2 weeks. EXAM: LEFT KNEE - COMPLETE 4+ VIEW COMPARISON:  None Available. FINDINGS: No specific palpable concern is indicated or marked. The mineralization and alignment are normal. There is no evidence of acute fracture or dislocation. The joint spaces are preserved. No joint effusion or other focal soft tissue abnormality identified. IMPRESSION: Unremarkable left knee radiographs. Electronically Signed   By: Elsie Perone M.D.   On: 11/24/2023 16:43     Assessment and Plan :     Discharge Instructions        1. Strain of left knee, initial encounter (Primary) - DG Knee Complete 4 Views Left x-ray performed in UC shows no acute bony injury to the left knee. - Follow-up with [facility] as scheduled on 11/29/2023 for further evaluation and management. - Take prescribed diclofenac  50 mg twice daily as needed for pain and inflammation for the knee and lower back pain -Continue to monitor symptoms for any change in severity if there is any escalation of current symptoms or development of new symptoms follow-up in ER for further evaluation and management.      Noah Smith Smith Noah Smith   Noah Smith, Noah Smith, Noah Smith 11/24/23 1651
